# Patient Record
Sex: Male | Born: 1935 | Race: White | Hispanic: No | Marital: Married | Smoking: Never smoker
Health system: Southern US, Community
[De-identification: ages and names within clinical notes are randomized; demographics above are authoritative.]

## PROBLEM LIST (undated history)

## (undated) DIAGNOSIS — K219 Gastro-esophageal reflux disease without esophagitis: Secondary | ICD-10-CM

## (undated) DIAGNOSIS — E7800 Pure hypercholesterolemia, unspecified: Secondary | ICD-10-CM

## (undated) HISTORY — PX: BLADDER SURGERY: SHX569

## (undated) HISTORY — PX: HEMORRHOID SURGERY: SHX153

## (undated) HISTORY — PX: OTHER SURGICAL HISTORY: SHX169

---

## 2014-07-05 ENCOUNTER — Ambulatory Visit (INDEPENDENT_AMBULATORY_CARE_PROVIDER_SITE_OTHER): Payer: Medicare Other | Admitting: Neurology

## 2014-07-05 ENCOUNTER — Other Ambulatory Visit: Payer: Self-pay | Admitting: *Deleted

## 2014-07-05 DIAGNOSIS — G5601 Carpal tunnel syndrome, right upper limb: Secondary | ICD-10-CM

## 2014-07-05 DIAGNOSIS — G5602 Carpal tunnel syndrome, left upper limb: Secondary | ICD-10-CM | POA: Diagnosis not present

## 2014-07-05 DIAGNOSIS — G5603 Carpal tunnel syndrome, bilateral upper limbs: Secondary | ICD-10-CM

## 2014-07-05 NOTE — Procedures (Signed)
[facility] Neurology  [place], [place]  [place], [place] [address] Tel: [phone] Fax:  [phone] Test Date:  07/05/2014  Patient: John Palmer DOB: 01/11/2026 Physician: Narda Amber  Sex: Male Height: 5\' 8"  Ref Phys: Monico Blitz, M.D.  ID#: [id number] Temp: 35.2C Technician: Jerilynn Mages. Dean   Patient Complaints: This is a 81 year old gentleman presenting for evaluation of bilateral hand numbness, worse on the right.    NCV & EMG Findings: Extensive electrodiagnostic testing of the right upper extremity and additional studies of the left shows:  1. Right median sensory response is absent. Right ulnar and radial sensory responses are within normal limits. Left median, ulnar, radial, and palmar sensory responses are within normal limits. 2. Evaluation of the right median motor nerve showed prolonged distal onset latency (5.0 ms) and reduced amplitude (3.9 mV).  Left median and bilateral ulnar motor responses are within normal limits.  3. There is no evidence of active or chronic motor axon loss changes affecting any of the tested muscles. Motor unit configuration and recruitment pattern is within normal limits  Impression: 1. Right median neuropathy at or distal to the wrist, consistent with clinical diagnosis of carpal tunnel syndrome. Overall, these findings are severe in degree electrically. 2. There is no evidence of left carpal tunnel syndrome or a cervical radiculopathy affecting the upper extremities.    ___________________________ Narda Amber    Nerve Conduction Studies Anti Sensory Summary Table   Stim Site NR Peak (ms) Norm Peak (ms) P-T Amp (V) Norm P-T Amp  Left Median Anti Sensory (2nd Digit)  Wrist    3.3 <3.7 20.1 >20  Right Median Anti Sensory (2nd Digit)  Wrist NR  <3.7  >20  Left Radial Anti Sensory (Base 1st Digit)  Wrist    1.7 <2.7 33.3 >20  Right Radial Anti Sensory (Base 1st Digit)  Wrist    1.8 <2.7 40.5 >20  Left Ulnar Anti Sensory (5th  Digit)  Wrist    2.6 <3.5 13.9 >10  Right Ulnar Anti Sensory (5th Digit)  Wrist    2.6 <3.5 15.0 >10   Motor Summary Table   Stim Site NR Onset (ms) Norm Onset (ms) O-P Amp (mV) Norm O-P Amp Site1 Site2 Delta-0 (ms) Dist (cm) Vel (m/s) Norm Vel (m/s)  Left Median Motor (Abd Poll Brev)  Wrist    3.6 <4.4 5.6 >4 Elbow Wrist 4.4 26.0 59 >49  Elbow    8.0  4.8         Right Median Motor (Abd Poll Brev)  Wrist    5.0 <4.4 3.9 >4 Elbow Wrist 4.8 27.0 56 >49  Elbow    9.8  3.9         Left Ulnar Motor (Abd Dig Minimi)  Wrist    2.6 <3.5 8.0 >6 B Elbow Wrist 3.7 20.0 54 >49  B Elbow    6.3  7.5  A Elbow B Elbow 1.5 10.0 67 >49  A Elbow    7.8  7.5         Right Ulnar Motor (Abd Dig Minimi)  Wrist    2.5 <3.5 7.7 >6 B Elbow Wrist 3.8 21.0 55 >49  B Elbow    6.3  7.7  A Elbow B Elbow 1.5 10.0 67 >49  A Elbow    7.8  7.5          Comparison Summary Table   Stim Site NR Peak (ms) Norm Peak (ms) P-T Amp (V) Site1  Site2 Delta-P (ms) Norm Delta (ms)  Left Median/Ulnar Palm Comparison (Wrist - 8cm)  Median Palm    2.1 <2.2 46.5 Median Palm Ulnar Palm 0.0   Ulnar Palm    2.1 <2.2 14.9       EMG   Side Muscle Ins Act Fibs Psw Fasc Number Recrt Dur Dur. Amp Amp. Poly Poly. Comment  Right 1stDorInt Nml Nml Nml Nml Nml Nml Nml Nml Nml Nml Nml Nml N/A  Right Abd Poll Brev Nml Nml Nml Nml Nml Nml Nml Nml Nml Nml Nml Nml N/A  Right Ext Indicis Nml Nml Nml Nml Nml Nml Nml Nml Nml Nml Nml Nml N/A  Right PronatorTeres Nml Nml Nml Nml Nml Nml Nml Nml Nml Nml Nml Nml N/A  Right Biceps Nml Nml Nml Nml Nml Nml Nml Nml Nml Nml Nml Nml N/A  Right Triceps Nml Nml Nml Nml Nml Nml Nml Nml Nml Nml Nml Nml N/A  Right Deltoid Nml Nml Nml Nml Nml Nml Nml Nml Nml Nml Nml Nml N/A  Left 1stDorInt Nml Nml Nml Nml Nml Nml Nml Nml Nml Nml Nml Nml N/A  Left Ext Indicis Nml Nml Nml Nml Nml Nml Nml Nml Nml Nml Nml Nml N/A  Left PronatorTeres Nml Nml Nml Nml Nml Nml Nml Nml Nml Nml Nml Nml N/A  Left Biceps Nml Nml Nml Nml  Nml Nml Nml Nml Nml Nml Nml Nml N/A  Left Triceps Nml Nml Nml Nml Nml Nml Nml Nml Nml Nml Nml Nml N/A  Left Deltoid Nml Nml Nml Nml Nml Nml Nml Nml Nml Nml Nml Nml N/A      Waveforms:

## 2014-07-11 ENCOUNTER — Telehealth: Payer: Self-pay | Admitting: *Deleted

## 2014-07-11 NOTE — Telephone Encounter (Signed)
Dr. Manuella Ghazi will not be back until the end of the month and they are wondering what they should do.  Any suggestions?

## 2014-07-11 NOTE — Telephone Encounter (Signed)
Mrs. Finucane notified.

## 2014-07-11 NOTE — Telephone Encounter (Signed)
patients wife would like to speak with you in reference to this patient John Palmer call back number [phone]

## 2014-07-11 NOTE — Telephone Encounter (Signed)
EMG showed right carpal tunnel syndrome only.  Please inform patient that they should call his office for additional questions as one of the other providers should be covering his patients.   Amand Lemoine K. Posey Pronto, DO

## 2015-03-06 DIAGNOSIS — G4733 Obstructive sleep apnea (adult) (pediatric): Secondary | ICD-10-CM | POA: Diagnosis not present

## 2015-03-06 DIAGNOSIS — G2581 Restless legs syndrome: Secondary | ICD-10-CM | POA: Diagnosis not present

## 2015-03-06 DIAGNOSIS — Z8673 Personal history of transient ischemic attack (TIA), and cerebral infarction without residual deficits: Secondary | ICD-10-CM | POA: Diagnosis not present

## 2015-03-06 DIAGNOSIS — R0902 Hypoxemia: Secondary | ICD-10-CM | POA: Diagnosis not present

## 2015-03-06 DIAGNOSIS — Z9989 Dependence on other enabling machines and devices: Secondary | ICD-10-CM | POA: Diagnosis not present

## 2015-03-07 DIAGNOSIS — N183 Chronic kidney disease, stage 3 (moderate): Secondary | ICD-10-CM | POA: Diagnosis not present

## 2015-03-07 DIAGNOSIS — E1122 Type 2 diabetes mellitus with diabetic chronic kidney disease: Secondary | ICD-10-CM | POA: Diagnosis not present

## 2015-03-07 DIAGNOSIS — Z6835 Body mass index (BMI) 35.0-35.9, adult: Secondary | ICD-10-CM | POA: Diagnosis not present

## 2015-03-07 DIAGNOSIS — Z87891 Personal history of nicotine dependence: Secondary | ICD-10-CM | POA: Diagnosis not present

## 2015-04-20 DIAGNOSIS — E7800 Pure hypercholesterolemia, unspecified: Secondary | ICD-10-CM | POA: Diagnosis not present

## 2015-04-20 DIAGNOSIS — M159 Polyosteoarthritis, unspecified: Secondary | ICD-10-CM | POA: Diagnosis not present

## 2015-04-20 DIAGNOSIS — J449 Chronic obstructive pulmonary disease, unspecified: Secondary | ICD-10-CM | POA: Diagnosis not present

## 2015-04-20 DIAGNOSIS — E119 Type 2 diabetes mellitus without complications: Secondary | ICD-10-CM | POA: Diagnosis not present

## 2015-05-10 DIAGNOSIS — D4989 Neoplasm of unspecified behavior of other specified sites: Secondary | ICD-10-CM | POA: Diagnosis not present

## 2015-06-14 DIAGNOSIS — E1122 Type 2 diabetes mellitus with diabetic chronic kidney disease: Secondary | ICD-10-CM | POA: Diagnosis not present

## 2015-06-14 DIAGNOSIS — G459 Transient cerebral ischemic attack, unspecified: Secondary | ICD-10-CM | POA: Diagnosis not present

## 2015-06-14 DIAGNOSIS — E7800 Pure hypercholesterolemia, unspecified: Secondary | ICD-10-CM | POA: Diagnosis not present

## 2015-06-14 DIAGNOSIS — N183 Chronic kidney disease, stage 3 (moderate): Secondary | ICD-10-CM | POA: Diagnosis not present

## 2015-07-04 DIAGNOSIS — J449 Chronic obstructive pulmonary disease, unspecified: Secondary | ICD-10-CM | POA: Diagnosis not present

## 2015-07-04 DIAGNOSIS — M159 Polyosteoarthritis, unspecified: Secondary | ICD-10-CM | POA: Diagnosis not present

## 2015-07-04 DIAGNOSIS — E7800 Pure hypercholesterolemia, unspecified: Secondary | ICD-10-CM | POA: Diagnosis not present

## 2015-07-04 DIAGNOSIS — E119 Type 2 diabetes mellitus without complications: Secondary | ICD-10-CM | POA: Diagnosis not present

## 2015-07-05 DIAGNOSIS — R200 Anesthesia of skin: Secondary | ICD-10-CM | POA: Diagnosis not present

## 2015-07-05 DIAGNOSIS — R202 Paresthesia of skin: Secondary | ICD-10-CM | POA: Diagnosis not present

## 2015-07-17 DIAGNOSIS — G5602 Carpal tunnel syndrome, left upper limb: Secondary | ICD-10-CM | POA: Diagnosis not present

## 2015-09-08 DIAGNOSIS — R200 Anesthesia of skin: Secondary | ICD-10-CM | POA: Diagnosis not present

## 2015-09-08 DIAGNOSIS — R202 Paresthesia of skin: Secondary | ICD-10-CM | POA: Diagnosis not present

## 2015-09-08 DIAGNOSIS — M790 Rheumatism, unspecified: Secondary | ICD-10-CM | POA: Diagnosis not present

## 2015-09-18 DIAGNOSIS — R0902 Hypoxemia: Secondary | ICD-10-CM | POA: Diagnosis not present

## 2015-09-18 DIAGNOSIS — G4733 Obstructive sleep apnea (adult) (pediatric): Secondary | ICD-10-CM | POA: Diagnosis not present

## 2015-09-27 DIAGNOSIS — E7800 Pure hypercholesterolemia, unspecified: Secondary | ICD-10-CM | POA: Diagnosis not present

## 2015-09-27 DIAGNOSIS — G459 Transient cerebral ischemic attack, unspecified: Secondary | ICD-10-CM | POA: Diagnosis not present

## 2015-09-27 DIAGNOSIS — G4730 Sleep apnea, unspecified: Secondary | ICD-10-CM | POA: Diagnosis not present

## 2015-09-27 DIAGNOSIS — G2581 Restless legs syndrome: Secondary | ICD-10-CM | POA: Diagnosis not present

## 2015-09-27 DIAGNOSIS — Z881 Allergy status to other antibiotic agents status: Secondary | ICD-10-CM | POA: Diagnosis not present

## 2015-09-27 DIAGNOSIS — F418 Other specified anxiety disorders: Secondary | ICD-10-CM | POA: Diagnosis not present

## 2015-09-27 DIAGNOSIS — J449 Chronic obstructive pulmonary disease, unspecified: Secondary | ICD-10-CM | POA: Diagnosis not present

## 2015-09-27 DIAGNOSIS — K219 Gastro-esophageal reflux disease without esophagitis: Secondary | ICD-10-CM | POA: Diagnosis not present

## 2015-09-27 DIAGNOSIS — Z87891 Personal history of nicotine dependence: Secondary | ICD-10-CM | POA: Diagnosis not present

## 2015-09-27 DIAGNOSIS — Z9981 Dependence on supplemental oxygen: Secondary | ICD-10-CM | POA: Diagnosis not present

## 2015-09-27 DIAGNOSIS — Z7982 Long term (current) use of aspirin: Secondary | ICD-10-CM | POA: Diagnosis not present

## 2015-09-27 DIAGNOSIS — Z8673 Personal history of transient ischemic attack (TIA), and cerebral infarction without residual deficits: Secondary | ICD-10-CM | POA: Diagnosis not present

## 2015-09-27 DIAGNOSIS — G5602 Carpal tunnel syndrome, left upper limb: Secondary | ICD-10-CM | POA: Diagnosis not present

## 2015-09-28 DIAGNOSIS — E119 Type 2 diabetes mellitus without complications: Secondary | ICD-10-CM | POA: Diagnosis not present

## 2015-09-28 DIAGNOSIS — E7800 Pure hypercholesterolemia, unspecified: Secondary | ICD-10-CM | POA: Diagnosis not present

## 2015-09-28 DIAGNOSIS — M159 Polyosteoarthritis, unspecified: Secondary | ICD-10-CM | POA: Diagnosis not present

## 2015-09-28 DIAGNOSIS — J449 Chronic obstructive pulmonary disease, unspecified: Secondary | ICD-10-CM | POA: Diagnosis not present

## 2015-11-09 DIAGNOSIS — Z23 Encounter for immunization: Secondary | ICD-10-CM | POA: Diagnosis not present

## 2015-11-17 DIAGNOSIS — M25511 Pain in right shoulder: Secondary | ICD-10-CM | POA: Diagnosis not present

## 2015-11-17 DIAGNOSIS — M25512 Pain in left shoulder: Secondary | ICD-10-CM | POA: Diagnosis not present

## 2015-11-23 DIAGNOSIS — M25512 Pain in left shoulder: Secondary | ICD-10-CM | POA: Diagnosis not present

## 2015-11-27 DIAGNOSIS — Z1389 Encounter for screening for other disorder: Secondary | ICD-10-CM | POA: Diagnosis not present

## 2015-11-27 DIAGNOSIS — R5383 Other fatigue: Secondary | ICD-10-CM | POA: Diagnosis not present

## 2015-11-27 DIAGNOSIS — Z1211 Encounter for screening for malignant neoplasm of colon: Secondary | ICD-10-CM | POA: Diagnosis not present

## 2015-11-27 DIAGNOSIS — E1122 Type 2 diabetes mellitus with diabetic chronic kidney disease: Secondary | ICD-10-CM | POA: Diagnosis not present

## 2015-11-27 DIAGNOSIS — E7800 Pure hypercholesterolemia, unspecified: Secondary | ICD-10-CM | POA: Diagnosis not present

## 2015-11-27 DIAGNOSIS — Z7189 Other specified counseling: Secondary | ICD-10-CM | POA: Diagnosis not present

## 2015-11-27 DIAGNOSIS — Z299 Encounter for prophylactic measures, unspecified: Secondary | ICD-10-CM | POA: Diagnosis not present

## 2015-11-27 DIAGNOSIS — Z6834 Body mass index (BMI) 34.0-34.9, adult: Secondary | ICD-10-CM | POA: Diagnosis not present

## 2015-11-27 DIAGNOSIS — Z0000 Encounter for general adult medical examination without abnormal findings: Secondary | ICD-10-CM | POA: Diagnosis not present

## 2015-11-28 DIAGNOSIS — J449 Chronic obstructive pulmonary disease, unspecified: Secondary | ICD-10-CM | POA: Diagnosis not present

## 2015-11-28 DIAGNOSIS — E119 Type 2 diabetes mellitus without complications: Secondary | ICD-10-CM | POA: Diagnosis not present

## 2015-11-28 DIAGNOSIS — Z125 Encounter for screening for malignant neoplasm of prostate: Secondary | ICD-10-CM | POA: Diagnosis not present

## 2015-11-28 DIAGNOSIS — E7800 Pure hypercholesterolemia, unspecified: Secondary | ICD-10-CM | POA: Diagnosis not present

## 2015-11-28 DIAGNOSIS — Z79899 Other long term (current) drug therapy: Secondary | ICD-10-CM | POA: Diagnosis not present

## 2015-11-28 DIAGNOSIS — R5383 Other fatigue: Secondary | ICD-10-CM | POA: Diagnosis not present

## 2015-11-28 DIAGNOSIS — M159 Polyosteoarthritis, unspecified: Secondary | ICD-10-CM | POA: Diagnosis not present

## 2015-12-15 DIAGNOSIS — R5383 Other fatigue: Secondary | ICD-10-CM | POA: Diagnosis not present

## 2015-12-28 DIAGNOSIS — E7800 Pure hypercholesterolemia, unspecified: Secondary | ICD-10-CM | POA: Diagnosis not present

## 2015-12-28 DIAGNOSIS — M159 Polyosteoarthritis, unspecified: Secondary | ICD-10-CM | POA: Diagnosis not present

## 2015-12-28 DIAGNOSIS — J449 Chronic obstructive pulmonary disease, unspecified: Secondary | ICD-10-CM | POA: Diagnosis not present

## 2015-12-28 DIAGNOSIS — E119 Type 2 diabetes mellitus without complications: Secondary | ICD-10-CM | POA: Diagnosis not present

## 2016-01-19 DIAGNOSIS — M159 Polyosteoarthritis, unspecified: Secondary | ICD-10-CM | POA: Diagnosis not present

## 2016-01-19 DIAGNOSIS — J449 Chronic obstructive pulmonary disease, unspecified: Secondary | ICD-10-CM | POA: Diagnosis not present

## 2016-01-19 DIAGNOSIS — E119 Type 2 diabetes mellitus without complications: Secondary | ICD-10-CM | POA: Diagnosis not present

## 2016-01-19 DIAGNOSIS — E7800 Pure hypercholesterolemia, unspecified: Secondary | ICD-10-CM | POA: Diagnosis not present

## 2016-03-06 DIAGNOSIS — G4733 Obstructive sleep apnea (adult) (pediatric): Secondary | ICD-10-CM | POA: Diagnosis not present

## 2016-03-06 DIAGNOSIS — G4734 Idiopathic sleep related nonobstructive alveolar hypoventilation: Secondary | ICD-10-CM | POA: Diagnosis not present

## 2016-03-25 DIAGNOSIS — E7800 Pure hypercholesterolemia, unspecified: Secondary | ICD-10-CM | POA: Diagnosis not present

## 2016-03-25 DIAGNOSIS — M159 Polyosteoarthritis, unspecified: Secondary | ICD-10-CM | POA: Diagnosis not present

## 2016-03-25 DIAGNOSIS — E119 Type 2 diabetes mellitus without complications: Secondary | ICD-10-CM | POA: Diagnosis not present

## 2016-03-25 DIAGNOSIS — J449 Chronic obstructive pulmonary disease, unspecified: Secondary | ICD-10-CM | POA: Diagnosis not present

## 2016-05-01 DIAGNOSIS — J449 Chronic obstructive pulmonary disease, unspecified: Secondary | ICD-10-CM | POA: Diagnosis not present

## 2016-05-01 DIAGNOSIS — K219 Gastro-esophageal reflux disease without esophagitis: Secondary | ICD-10-CM | POA: Diagnosis not present

## 2016-05-01 DIAGNOSIS — N400 Enlarged prostate without lower urinary tract symptoms: Secondary | ICD-10-CM | POA: Diagnosis not present

## 2016-05-01 DIAGNOSIS — G5603 Carpal tunnel syndrome, bilateral upper limbs: Secondary | ICD-10-CM | POA: Diagnosis not present

## 2016-05-01 DIAGNOSIS — E7800 Pure hypercholesterolemia, unspecified: Secondary | ICD-10-CM | POA: Diagnosis not present

## 2016-05-01 DIAGNOSIS — F419 Anxiety disorder, unspecified: Secondary | ICD-10-CM | POA: Diagnosis not present

## 2016-05-01 DIAGNOSIS — E1122 Type 2 diabetes mellitus with diabetic chronic kidney disease: Secondary | ICD-10-CM | POA: Diagnosis not present

## 2016-05-01 DIAGNOSIS — Z6836 Body mass index (BMI) 36.0-36.9, adult: Secondary | ICD-10-CM | POA: Diagnosis not present

## 2016-05-01 DIAGNOSIS — G4733 Obstructive sleep apnea (adult) (pediatric): Secondary | ICD-10-CM | POA: Diagnosis not present

## 2016-05-01 DIAGNOSIS — Z299 Encounter for prophylactic measures, unspecified: Secondary | ICD-10-CM | POA: Diagnosis not present

## 2016-05-01 DIAGNOSIS — N181 Chronic kidney disease, stage 1: Secondary | ICD-10-CM | POA: Diagnosis not present

## 2016-05-01 DIAGNOSIS — Z87891 Personal history of nicotine dependence: Secondary | ICD-10-CM | POA: Diagnosis not present

## 2016-05-07 DIAGNOSIS — G4733 Obstructive sleep apnea (adult) (pediatric): Secondary | ICD-10-CM | POA: Diagnosis not present

## 2016-05-08 DIAGNOSIS — D3132 Benign neoplasm of left choroid: Secondary | ICD-10-CM | POA: Diagnosis not present

## 2016-05-29 DIAGNOSIS — Z85828 Personal history of other malignant neoplasm of skin: Secondary | ICD-10-CM | POA: Diagnosis not present

## 2016-05-29 DIAGNOSIS — L570 Actinic keratosis: Secondary | ICD-10-CM | POA: Diagnosis not present

## 2016-05-29 DIAGNOSIS — L814 Other melanin hyperpigmentation: Secondary | ICD-10-CM | POA: Diagnosis not present

## 2016-05-29 DIAGNOSIS — D1801 Hemangioma of skin and subcutaneous tissue: Secondary | ICD-10-CM | POA: Diagnosis not present

## 2016-05-29 DIAGNOSIS — Z08 Encounter for follow-up examination after completed treatment for malignant neoplasm: Secondary | ICD-10-CM | POA: Diagnosis not present

## 2016-05-29 DIAGNOSIS — L821 Other seborrheic keratosis: Secondary | ICD-10-CM | POA: Diagnosis not present

## 2016-06-27 DIAGNOSIS — R9431 Abnormal electrocardiogram [ECG] [EKG]: Secondary | ICD-10-CM | POA: Diagnosis not present

## 2016-06-27 DIAGNOSIS — E785 Hyperlipidemia, unspecified: Secondary | ICD-10-CM | POA: Diagnosis not present

## 2016-06-27 DIAGNOSIS — I208 Other forms of angina pectoris: Secondary | ICD-10-CM | POA: Diagnosis not present

## 2016-06-27 DIAGNOSIS — R011 Cardiac murmur, unspecified: Secondary | ICD-10-CM | POA: Diagnosis not present

## 2016-06-27 DIAGNOSIS — G4733 Obstructive sleep apnea (adult) (pediatric): Secondary | ICD-10-CM | POA: Diagnosis not present

## 2016-06-27 DIAGNOSIS — G459 Transient cerebral ischemic attack, unspecified: Secondary | ICD-10-CM | POA: Diagnosis not present

## 2016-06-27 DIAGNOSIS — I4510 Unspecified right bundle-branch block: Secondary | ICD-10-CM | POA: Diagnosis not present

## 2016-06-27 DIAGNOSIS — R0609 Other forms of dyspnea: Secondary | ICD-10-CM | POA: Diagnosis not present

## 2016-07-01 DIAGNOSIS — I083 Combined rheumatic disorders of mitral, aortic and tricuspid valves: Secondary | ICD-10-CM | POA: Diagnosis not present

## 2016-07-01 DIAGNOSIS — R9431 Abnormal electrocardiogram [ECG] [EKG]: Secondary | ICD-10-CM | POA: Diagnosis not present

## 2016-07-01 DIAGNOSIS — R011 Cardiac murmur, unspecified: Secondary | ICD-10-CM | POA: Diagnosis not present

## 2016-07-01 DIAGNOSIS — R0609 Other forms of dyspnea: Secondary | ICD-10-CM | POA: Diagnosis not present

## 2016-07-12 DIAGNOSIS — I208 Other forms of angina pectoris: Secondary | ICD-10-CM | POA: Diagnosis not present

## 2016-07-12 DIAGNOSIS — R9431 Abnormal electrocardiogram [ECG] [EKG]: Secondary | ICD-10-CM | POA: Diagnosis not present

## 2016-07-12 DIAGNOSIS — R011 Cardiac murmur, unspecified: Secondary | ICD-10-CM | POA: Diagnosis not present

## 2016-07-12 DIAGNOSIS — R0609 Other forms of dyspnea: Secondary | ICD-10-CM | POA: Diagnosis not present

## 2016-07-12 DIAGNOSIS — G4733 Obstructive sleep apnea (adult) (pediatric): Secondary | ICD-10-CM | POA: Diagnosis not present

## 2016-07-15 DIAGNOSIS — Z6833 Body mass index (BMI) 33.0-33.9, adult: Secondary | ICD-10-CM | POA: Diagnosis not present

## 2016-07-15 DIAGNOSIS — G4734 Idiopathic sleep related nonobstructive alveolar hypoventilation: Secondary | ICD-10-CM | POA: Diagnosis not present

## 2016-07-15 DIAGNOSIS — E669 Obesity, unspecified: Secondary | ICD-10-CM | POA: Diagnosis not present

## 2016-07-15 DIAGNOSIS — G4733 Obstructive sleep apnea (adult) (pediatric): Secondary | ICD-10-CM | POA: Diagnosis not present

## 2016-07-15 DIAGNOSIS — R011 Cardiac murmur, unspecified: Secondary | ICD-10-CM | POA: Diagnosis not present

## 2016-07-16 DIAGNOSIS — R9431 Abnormal electrocardiogram [ECG] [EKG]: Secondary | ICD-10-CM | POA: Diagnosis not present

## 2016-07-16 DIAGNOSIS — R011 Cardiac murmur, unspecified: Secondary | ICD-10-CM | POA: Diagnosis not present

## 2016-07-16 DIAGNOSIS — R0602 Shortness of breath: Secondary | ICD-10-CM | POA: Diagnosis not present

## 2016-07-16 DIAGNOSIS — I209 Angina pectoris, unspecified: Secondary | ICD-10-CM | POA: Diagnosis not present

## 2016-07-16 DIAGNOSIS — R0789 Other chest pain: Secondary | ICD-10-CM | POA: Diagnosis not present

## 2016-07-16 DIAGNOSIS — R0609 Other forms of dyspnea: Secondary | ICD-10-CM | POA: Diagnosis not present

## 2016-08-07 DIAGNOSIS — E7800 Pure hypercholesterolemia, unspecified: Secondary | ICD-10-CM | POA: Diagnosis not present

## 2016-08-07 DIAGNOSIS — J449 Chronic obstructive pulmonary disease, unspecified: Secondary | ICD-10-CM | POA: Diagnosis not present

## 2016-08-07 DIAGNOSIS — Z299 Encounter for prophylactic measures, unspecified: Secondary | ICD-10-CM | POA: Diagnosis not present

## 2016-08-07 DIAGNOSIS — Z6835 Body mass index (BMI) 35.0-35.9, adult: Secondary | ICD-10-CM | POA: Diagnosis not present

## 2016-08-07 DIAGNOSIS — F419 Anxiety disorder, unspecified: Secondary | ICD-10-CM | POA: Diagnosis not present

## 2016-08-07 DIAGNOSIS — E1122 Type 2 diabetes mellitus with diabetic chronic kidney disease: Secondary | ICD-10-CM | POA: Diagnosis not present

## 2016-08-07 DIAGNOSIS — R197 Diarrhea, unspecified: Secondary | ICD-10-CM | POA: Diagnosis not present

## 2016-08-07 DIAGNOSIS — N400 Enlarged prostate without lower urinary tract symptoms: Secondary | ICD-10-CM | POA: Diagnosis not present

## 2016-08-07 DIAGNOSIS — G4733 Obstructive sleep apnea (adult) (pediatric): Secondary | ICD-10-CM | POA: Diagnosis not present

## 2016-08-07 DIAGNOSIS — N183 Chronic kidney disease, stage 3 (moderate): Secondary | ICD-10-CM | POA: Diagnosis not present

## 2016-08-29 DIAGNOSIS — R0902 Hypoxemia: Secondary | ICD-10-CM | POA: Diagnosis not present

## 2016-08-29 DIAGNOSIS — G4733 Obstructive sleep apnea (adult) (pediatric): Secondary | ICD-10-CM | POA: Diagnosis not present

## 2016-09-09 DIAGNOSIS — G4734 Idiopathic sleep related nonobstructive alveolar hypoventilation: Secondary | ICD-10-CM | POA: Diagnosis not present

## 2016-09-09 DIAGNOSIS — G4733 Obstructive sleep apnea (adult) (pediatric): Secondary | ICD-10-CM | POA: Diagnosis not present

## 2016-09-09 DIAGNOSIS — Z6833 Body mass index (BMI) 33.0-33.9, adult: Secondary | ICD-10-CM | POA: Diagnosis not present

## 2016-09-09 DIAGNOSIS — R030 Elevated blood-pressure reading, without diagnosis of hypertension: Secondary | ICD-10-CM | POA: Diagnosis not present

## 2016-09-09 DIAGNOSIS — G2581 Restless legs syndrome: Secondary | ICD-10-CM | POA: Diagnosis not present

## 2016-09-10 DIAGNOSIS — G4733 Obstructive sleep apnea (adult) (pediatric): Secondary | ICD-10-CM | POA: Diagnosis not present

## 2016-10-21 DIAGNOSIS — G4734 Idiopathic sleep related nonobstructive alveolar hypoventilation: Secondary | ICD-10-CM | POA: Diagnosis not present

## 2016-10-21 DIAGNOSIS — Z23 Encounter for immunization: Secondary | ICD-10-CM | POA: Diagnosis not present

## 2016-10-21 DIAGNOSIS — E669 Obesity, unspecified: Secondary | ICD-10-CM | POA: Diagnosis not present

## 2016-10-21 DIAGNOSIS — G4733 Obstructive sleep apnea (adult) (pediatric): Secondary | ICD-10-CM | POA: Diagnosis not present

## 2016-10-23 DIAGNOSIS — M159 Polyosteoarthritis, unspecified: Secondary | ICD-10-CM | POA: Diagnosis not present

## 2016-10-23 DIAGNOSIS — J449 Chronic obstructive pulmonary disease, unspecified: Secondary | ICD-10-CM | POA: Diagnosis not present

## 2016-10-23 DIAGNOSIS — E119 Type 2 diabetes mellitus without complications: Secondary | ICD-10-CM | POA: Diagnosis not present

## 2016-10-23 DIAGNOSIS — E7800 Pure hypercholesterolemia, unspecified: Secondary | ICD-10-CM | POA: Diagnosis not present

## 2016-11-13 DIAGNOSIS — E1122 Type 2 diabetes mellitus with diabetic chronic kidney disease: Secondary | ICD-10-CM | POA: Diagnosis not present

## 2016-11-13 DIAGNOSIS — J449 Chronic obstructive pulmonary disease, unspecified: Secondary | ICD-10-CM | POA: Diagnosis not present

## 2016-11-13 DIAGNOSIS — E1165 Type 2 diabetes mellitus with hyperglycemia: Secondary | ICD-10-CM | POA: Diagnosis not present

## 2016-11-13 DIAGNOSIS — Z6835 Body mass index (BMI) 35.0-35.9, adult: Secondary | ICD-10-CM | POA: Diagnosis not present

## 2016-11-13 DIAGNOSIS — N183 Chronic kidney disease, stage 3 (moderate): Secondary | ICD-10-CM | POA: Diagnosis not present

## 2016-11-13 DIAGNOSIS — Z299 Encounter for prophylactic measures, unspecified: Secondary | ICD-10-CM | POA: Diagnosis not present

## 2016-11-22 DIAGNOSIS — M159 Polyosteoarthritis, unspecified: Secondary | ICD-10-CM | POA: Diagnosis not present

## 2016-11-22 DIAGNOSIS — E119 Type 2 diabetes mellitus without complications: Secondary | ICD-10-CM | POA: Diagnosis not present

## 2016-11-22 DIAGNOSIS — E7800 Pure hypercholesterolemia, unspecified: Secondary | ICD-10-CM | POA: Diagnosis not present

## 2016-11-22 DIAGNOSIS — J449 Chronic obstructive pulmonary disease, unspecified: Secondary | ICD-10-CM | POA: Diagnosis not present

## 2016-12-12 DIAGNOSIS — Z1339 Encounter for screening examination for other mental health and behavioral disorders: Secondary | ICD-10-CM | POA: Diagnosis not present

## 2016-12-12 DIAGNOSIS — E7800 Pure hypercholesterolemia, unspecified: Secondary | ICD-10-CM | POA: Diagnosis not present

## 2016-12-12 DIAGNOSIS — Z7189 Other specified counseling: Secondary | ICD-10-CM | POA: Diagnosis not present

## 2016-12-12 DIAGNOSIS — Z1331 Encounter for screening for depression: Secondary | ICD-10-CM | POA: Diagnosis not present

## 2016-12-12 DIAGNOSIS — R5383 Other fatigue: Secondary | ICD-10-CM | POA: Diagnosis not present

## 2016-12-12 DIAGNOSIS — Z0000 Encounter for general adult medical examination without abnormal findings: Secondary | ICD-10-CM | POA: Diagnosis not present

## 2016-12-12 DIAGNOSIS — Z299 Encounter for prophylactic measures, unspecified: Secondary | ICD-10-CM | POA: Diagnosis not present

## 2016-12-12 DIAGNOSIS — Z79899 Other long term (current) drug therapy: Secondary | ICD-10-CM | POA: Diagnosis not present

## 2016-12-12 DIAGNOSIS — F419 Anxiety disorder, unspecified: Secondary | ICD-10-CM | POA: Diagnosis not present

## 2016-12-12 DIAGNOSIS — Z6834 Body mass index (BMI) 34.0-34.9, adult: Secondary | ICD-10-CM | POA: Diagnosis not present

## 2016-12-12 DIAGNOSIS — Z125 Encounter for screening for malignant neoplasm of prostate: Secondary | ICD-10-CM | POA: Diagnosis not present

## 2017-01-17 DIAGNOSIS — Z9181 History of falling: Secondary | ICD-10-CM | POA: Diagnosis not present

## 2017-01-17 DIAGNOSIS — G4733 Obstructive sleep apnea (adult) (pediatric): Secondary | ICD-10-CM | POA: Diagnosis not present

## 2017-01-17 DIAGNOSIS — R011 Cardiac murmur, unspecified: Secondary | ICD-10-CM | POA: Diagnosis not present

## 2017-01-17 DIAGNOSIS — G2581 Restless legs syndrome: Secondary | ICD-10-CM | POA: Diagnosis not present

## 2017-01-17 DIAGNOSIS — Z6833 Body mass index (BMI) 33.0-33.9, adult: Secondary | ICD-10-CM | POA: Diagnosis not present

## 2017-02-12 DIAGNOSIS — Z6835 Body mass index (BMI) 35.0-35.9, adult: Secondary | ICD-10-CM | POA: Diagnosis not present

## 2017-02-12 DIAGNOSIS — Z299 Encounter for prophylactic measures, unspecified: Secondary | ICD-10-CM | POA: Diagnosis not present

## 2017-02-12 DIAGNOSIS — E1122 Type 2 diabetes mellitus with diabetic chronic kidney disease: Secondary | ICD-10-CM | POA: Diagnosis not present

## 2017-02-12 DIAGNOSIS — E1165 Type 2 diabetes mellitus with hyperglycemia: Secondary | ICD-10-CM | POA: Diagnosis not present

## 2017-02-12 DIAGNOSIS — N183 Chronic kidney disease, stage 3 (moderate): Secondary | ICD-10-CM | POA: Diagnosis not present

## 2017-02-13 DIAGNOSIS — E7800 Pure hypercholesterolemia, unspecified: Secondary | ICD-10-CM | POA: Diagnosis not present

## 2017-02-13 DIAGNOSIS — J449 Chronic obstructive pulmonary disease, unspecified: Secondary | ICD-10-CM | POA: Diagnosis not present

## 2017-02-13 DIAGNOSIS — E119 Type 2 diabetes mellitus without complications: Secondary | ICD-10-CM | POA: Diagnosis not present

## 2017-02-13 DIAGNOSIS — M159 Polyosteoarthritis, unspecified: Secondary | ICD-10-CM | POA: Diagnosis not present

## 2017-03-06 DIAGNOSIS — Z9181 History of falling: Secondary | ICD-10-CM | POA: Diagnosis not present

## 2017-03-06 DIAGNOSIS — G4733 Obstructive sleep apnea (adult) (pediatric): Secondary | ICD-10-CM | POA: Diagnosis not present

## 2017-03-14 DIAGNOSIS — M159 Polyosteoarthritis, unspecified: Secondary | ICD-10-CM | POA: Diagnosis not present

## 2017-03-14 DIAGNOSIS — J449 Chronic obstructive pulmonary disease, unspecified: Secondary | ICD-10-CM | POA: Diagnosis not present

## 2017-03-14 DIAGNOSIS — E7800 Pure hypercholesterolemia, unspecified: Secondary | ICD-10-CM | POA: Diagnosis not present

## 2017-03-14 DIAGNOSIS — E119 Type 2 diabetes mellitus without complications: Secondary | ICD-10-CM | POA: Diagnosis not present

## 2017-03-21 DIAGNOSIS — R011 Cardiac murmur, unspecified: Secondary | ICD-10-CM | POA: Diagnosis not present

## 2017-03-21 DIAGNOSIS — G459 Transient cerebral ischemic attack, unspecified: Secondary | ICD-10-CM | POA: Diagnosis not present

## 2017-03-21 DIAGNOSIS — I208 Other forms of angina pectoris: Secondary | ICD-10-CM | POA: Diagnosis not present

## 2017-03-21 DIAGNOSIS — R0609 Other forms of dyspnea: Secondary | ICD-10-CM | POA: Diagnosis not present

## 2017-03-21 DIAGNOSIS — E785 Hyperlipidemia, unspecified: Secondary | ICD-10-CM | POA: Diagnosis not present

## 2017-03-21 DIAGNOSIS — G4733 Obstructive sleep apnea (adult) (pediatric): Secondary | ICD-10-CM | POA: Diagnosis not present

## 2017-03-21 DIAGNOSIS — R9431 Abnormal electrocardiogram [ECG] [EKG]: Secondary | ICD-10-CM | POA: Diagnosis not present

## 2017-04-10 DIAGNOSIS — M159 Polyosteoarthritis, unspecified: Secondary | ICD-10-CM | POA: Diagnosis not present

## 2017-04-10 DIAGNOSIS — E7800 Pure hypercholesterolemia, unspecified: Secondary | ICD-10-CM | POA: Diagnosis not present

## 2017-04-10 DIAGNOSIS — J449 Chronic obstructive pulmonary disease, unspecified: Secondary | ICD-10-CM | POA: Diagnosis not present

## 2017-04-10 DIAGNOSIS — E119 Type 2 diabetes mellitus without complications: Secondary | ICD-10-CM | POA: Diagnosis not present

## 2017-05-19 DIAGNOSIS — Z87891 Personal history of nicotine dependence: Secondary | ICD-10-CM | POA: Diagnosis not present

## 2017-05-19 DIAGNOSIS — Z299 Encounter for prophylactic measures, unspecified: Secondary | ICD-10-CM | POA: Diagnosis not present

## 2017-05-19 DIAGNOSIS — E1165 Type 2 diabetes mellitus with hyperglycemia: Secondary | ICD-10-CM | POA: Diagnosis not present

## 2017-05-19 DIAGNOSIS — Z6836 Body mass index (BMI) 36.0-36.9, adult: Secondary | ICD-10-CM | POA: Diagnosis not present

## 2017-05-19 DIAGNOSIS — E1122 Type 2 diabetes mellitus with diabetic chronic kidney disease: Secondary | ICD-10-CM | POA: Diagnosis not present

## 2017-05-21 DIAGNOSIS — M159 Polyosteoarthritis, unspecified: Secondary | ICD-10-CM | POA: Diagnosis not present

## 2017-05-21 DIAGNOSIS — J449 Chronic obstructive pulmonary disease, unspecified: Secondary | ICD-10-CM | POA: Diagnosis not present

## 2017-05-21 DIAGNOSIS — E119 Type 2 diabetes mellitus without complications: Secondary | ICD-10-CM | POA: Diagnosis not present

## 2017-05-21 DIAGNOSIS — E7800 Pure hypercholesterolemia, unspecified: Secondary | ICD-10-CM | POA: Diagnosis not present

## 2017-06-13 DIAGNOSIS — M159 Polyosteoarthritis, unspecified: Secondary | ICD-10-CM | POA: Diagnosis not present

## 2017-06-13 DIAGNOSIS — E119 Type 2 diabetes mellitus without complications: Secondary | ICD-10-CM | POA: Diagnosis not present

## 2017-06-13 DIAGNOSIS — J449 Chronic obstructive pulmonary disease, unspecified: Secondary | ICD-10-CM | POA: Diagnosis not present

## 2017-06-13 DIAGNOSIS — E7800 Pure hypercholesterolemia, unspecified: Secondary | ICD-10-CM | POA: Diagnosis not present

## 2017-07-14 DIAGNOSIS — Z961 Presence of intraocular lens: Secondary | ICD-10-CM | POA: Diagnosis not present

## 2017-07-14 DIAGNOSIS — H40023 Open angle with borderline findings, high risk, bilateral: Secondary | ICD-10-CM | POA: Diagnosis not present

## 2017-07-14 DIAGNOSIS — H0289 Other specified disorders of eyelid: Secondary | ICD-10-CM | POA: Diagnosis not present

## 2017-07-15 DIAGNOSIS — H40023 Open angle with borderline findings, high risk, bilateral: Secondary | ICD-10-CM | POA: Diagnosis not present

## 2017-08-05 DIAGNOSIS — M159 Polyosteoarthritis, unspecified: Secondary | ICD-10-CM | POA: Diagnosis not present

## 2017-08-05 DIAGNOSIS — E7800 Pure hypercholesterolemia, unspecified: Secondary | ICD-10-CM | POA: Diagnosis not present

## 2017-08-05 DIAGNOSIS — J449 Chronic obstructive pulmonary disease, unspecified: Secondary | ICD-10-CM | POA: Diagnosis not present

## 2017-08-05 DIAGNOSIS — E119 Type 2 diabetes mellitus without complications: Secondary | ICD-10-CM | POA: Diagnosis not present

## 2017-08-25 DIAGNOSIS — Z299 Encounter for prophylactic measures, unspecified: Secondary | ICD-10-CM | POA: Diagnosis not present

## 2017-08-25 DIAGNOSIS — E1122 Type 2 diabetes mellitus with diabetic chronic kidney disease: Secondary | ICD-10-CM | POA: Diagnosis not present

## 2017-08-25 DIAGNOSIS — Z6836 Body mass index (BMI) 36.0-36.9, adult: Secondary | ICD-10-CM | POA: Diagnosis not present

## 2017-08-25 DIAGNOSIS — E1165 Type 2 diabetes mellitus with hyperglycemia: Secondary | ICD-10-CM | POA: Diagnosis not present

## 2017-08-25 DIAGNOSIS — N183 Chronic kidney disease, stage 3 (moderate): Secondary | ICD-10-CM | POA: Diagnosis not present

## 2017-08-25 DIAGNOSIS — J449 Chronic obstructive pulmonary disease, unspecified: Secondary | ICD-10-CM | POA: Diagnosis not present

## 2017-09-02 DIAGNOSIS — J449 Chronic obstructive pulmonary disease, unspecified: Secondary | ICD-10-CM | POA: Diagnosis not present

## 2017-09-02 DIAGNOSIS — E7800 Pure hypercholesterolemia, unspecified: Secondary | ICD-10-CM | POA: Diagnosis not present

## 2017-09-02 DIAGNOSIS — E119 Type 2 diabetes mellitus without complications: Secondary | ICD-10-CM | POA: Diagnosis not present

## 2017-09-02 DIAGNOSIS — M159 Polyosteoarthritis, unspecified: Secondary | ICD-10-CM | POA: Diagnosis not present

## 2017-09-19 DIAGNOSIS — G459 Transient cerebral ischemic attack, unspecified: Secondary | ICD-10-CM | POA: Diagnosis not present

## 2017-09-19 DIAGNOSIS — I208 Other forms of angina pectoris: Secondary | ICD-10-CM | POA: Diagnosis not present

## 2017-09-19 DIAGNOSIS — Z136 Encounter for screening for cardiovascular disorders: Secondary | ICD-10-CM | POA: Diagnosis not present

## 2017-09-19 DIAGNOSIS — R011 Cardiac murmur, unspecified: Secondary | ICD-10-CM | POA: Diagnosis not present

## 2017-09-19 DIAGNOSIS — E785 Hyperlipidemia, unspecified: Secondary | ICD-10-CM | POA: Diagnosis not present

## 2017-09-19 DIAGNOSIS — G4733 Obstructive sleep apnea (adult) (pediatric): Secondary | ICD-10-CM | POA: Diagnosis not present

## 2017-09-19 DIAGNOSIS — R9431 Abnormal electrocardiogram [ECG] [EKG]: Secondary | ICD-10-CM | POA: Diagnosis not present

## 2017-09-19 DIAGNOSIS — R0609 Other forms of dyspnea: Secondary | ICD-10-CM | POA: Diagnosis not present

## 2017-10-01 DIAGNOSIS — M159 Polyosteoarthritis, unspecified: Secondary | ICD-10-CM | POA: Diagnosis not present

## 2017-10-01 DIAGNOSIS — J449 Chronic obstructive pulmonary disease, unspecified: Secondary | ICD-10-CM | POA: Diagnosis not present

## 2017-10-01 DIAGNOSIS — E119 Type 2 diabetes mellitus without complications: Secondary | ICD-10-CM | POA: Diagnosis not present

## 2017-10-01 DIAGNOSIS — E7800 Pure hypercholesterolemia, unspecified: Secondary | ICD-10-CM | POA: Diagnosis not present

## 2017-10-31 DIAGNOSIS — J449 Chronic obstructive pulmonary disease, unspecified: Secondary | ICD-10-CM | POA: Diagnosis not present

## 2017-10-31 DIAGNOSIS — E119 Type 2 diabetes mellitus without complications: Secondary | ICD-10-CM | POA: Diagnosis not present

## 2017-10-31 DIAGNOSIS — E7800 Pure hypercholesterolemia, unspecified: Secondary | ICD-10-CM | POA: Diagnosis not present

## 2017-10-31 DIAGNOSIS — M159 Polyosteoarthritis, unspecified: Secondary | ICD-10-CM | POA: Diagnosis not present

## 2017-11-17 DIAGNOSIS — Z23 Encounter for immunization: Secondary | ICD-10-CM | POA: Diagnosis not present

## 2017-11-28 DIAGNOSIS — N183 Chronic kidney disease, stage 3 (moderate): Secondary | ICD-10-CM | POA: Diagnosis not present

## 2017-11-28 DIAGNOSIS — Z6835 Body mass index (BMI) 35.0-35.9, adult: Secondary | ICD-10-CM | POA: Diagnosis not present

## 2017-11-28 DIAGNOSIS — Z299 Encounter for prophylactic measures, unspecified: Secondary | ICD-10-CM | POA: Diagnosis not present

## 2017-11-28 DIAGNOSIS — E7800 Pure hypercholesterolemia, unspecified: Secondary | ICD-10-CM | POA: Diagnosis not present

## 2017-11-28 DIAGNOSIS — E1122 Type 2 diabetes mellitus with diabetic chronic kidney disease: Secondary | ICD-10-CM | POA: Diagnosis not present

## 2017-11-28 DIAGNOSIS — E1165 Type 2 diabetes mellitus with hyperglycemia: Secondary | ICD-10-CM | POA: Diagnosis not present

## 2017-12-03 DIAGNOSIS — M1712 Unilateral primary osteoarthritis, left knee: Secondary | ICD-10-CM | POA: Diagnosis not present

## 2017-12-03 DIAGNOSIS — Z6835 Body mass index (BMI) 35.0-35.9, adult: Secondary | ICD-10-CM | POA: Diagnosis not present

## 2017-12-03 DIAGNOSIS — Z299 Encounter for prophylactic measures, unspecified: Secondary | ICD-10-CM | POA: Diagnosis not present

## 2017-12-04 DIAGNOSIS — E119 Type 2 diabetes mellitus without complications: Secondary | ICD-10-CM | POA: Diagnosis not present

## 2017-12-04 DIAGNOSIS — M159 Polyosteoarthritis, unspecified: Secondary | ICD-10-CM | POA: Diagnosis not present

## 2017-12-04 DIAGNOSIS — J449 Chronic obstructive pulmonary disease, unspecified: Secondary | ICD-10-CM | POA: Diagnosis not present

## 2017-12-04 DIAGNOSIS — E7800 Pure hypercholesterolemia, unspecified: Secondary | ICD-10-CM | POA: Diagnosis not present

## 2017-12-17 DIAGNOSIS — Z1211 Encounter for screening for malignant neoplasm of colon: Secondary | ICD-10-CM | POA: Diagnosis not present

## 2017-12-17 DIAGNOSIS — Z125 Encounter for screening for malignant neoplasm of prostate: Secondary | ICD-10-CM | POA: Diagnosis not present

## 2017-12-17 DIAGNOSIS — R5383 Other fatigue: Secondary | ICD-10-CM | POA: Diagnosis not present

## 2017-12-17 DIAGNOSIS — E1122 Type 2 diabetes mellitus with diabetic chronic kidney disease: Secondary | ICD-10-CM | POA: Diagnosis not present

## 2017-12-17 DIAGNOSIS — Z1339 Encounter for screening examination for other mental health and behavioral disorders: Secondary | ICD-10-CM | POA: Diagnosis not present

## 2017-12-17 DIAGNOSIS — Z79899 Other long term (current) drug therapy: Secondary | ICD-10-CM | POA: Diagnosis not present

## 2017-12-17 DIAGNOSIS — Z0000 Encounter for general adult medical examination without abnormal findings: Secondary | ICD-10-CM | POA: Diagnosis not present

## 2017-12-17 DIAGNOSIS — Z1331 Encounter for screening for depression: Secondary | ICD-10-CM | POA: Diagnosis not present

## 2017-12-17 DIAGNOSIS — E7800 Pure hypercholesterolemia, unspecified: Secondary | ICD-10-CM | POA: Diagnosis not present

## 2017-12-17 DIAGNOSIS — Z7189 Other specified counseling: Secondary | ICD-10-CM | POA: Diagnosis not present

## 2017-12-17 DIAGNOSIS — Z299 Encounter for prophylactic measures, unspecified: Secondary | ICD-10-CM | POA: Diagnosis not present

## 2017-12-17 DIAGNOSIS — Z6835 Body mass index (BMI) 35.0-35.9, adult: Secondary | ICD-10-CM | POA: Diagnosis not present

## 2017-12-29 DIAGNOSIS — Z6835 Body mass index (BMI) 35.0-35.9, adult: Secondary | ICD-10-CM | POA: Diagnosis not present

## 2017-12-29 DIAGNOSIS — Z299 Encounter for prophylactic measures, unspecified: Secondary | ICD-10-CM | POA: Diagnosis not present

## 2017-12-29 DIAGNOSIS — R195 Other fecal abnormalities: Secondary | ICD-10-CM | POA: Diagnosis not present

## 2017-12-31 DIAGNOSIS — Z86010 Personal history of colonic polyps: Secondary | ICD-10-CM | POA: Diagnosis not present

## 2017-12-31 DIAGNOSIS — R195 Other fecal abnormalities: Secondary | ICD-10-CM | POA: Diagnosis not present

## 2018-01-02 DIAGNOSIS — J449 Chronic obstructive pulmonary disease, unspecified: Secondary | ICD-10-CM | POA: Diagnosis not present

## 2018-01-02 DIAGNOSIS — E119 Type 2 diabetes mellitus without complications: Secondary | ICD-10-CM | POA: Diagnosis not present

## 2018-01-02 DIAGNOSIS — E7800 Pure hypercholesterolemia, unspecified: Secondary | ICD-10-CM | POA: Diagnosis not present

## 2018-01-02 DIAGNOSIS — M159 Polyosteoarthritis, unspecified: Secondary | ICD-10-CM | POA: Diagnosis not present

## 2018-01-05 DIAGNOSIS — K625 Hemorrhage of anus and rectum: Secondary | ICD-10-CM | POA: Diagnosis not present

## 2018-01-05 DIAGNOSIS — Z79899 Other long term (current) drug therapy: Secondary | ICD-10-CM | POA: Diagnosis not present

## 2018-01-05 DIAGNOSIS — K921 Melena: Secondary | ICD-10-CM | POA: Diagnosis not present

## 2018-01-05 DIAGNOSIS — K5730 Diverticulosis of large intestine without perforation or abscess without bleeding: Secondary | ICD-10-CM | POA: Diagnosis not present

## 2018-01-05 DIAGNOSIS — Z86010 Personal history of colonic polyps: Secondary | ICD-10-CM | POA: Diagnosis not present

## 2018-01-05 DIAGNOSIS — G4730 Sleep apnea, unspecified: Secondary | ICD-10-CM | POA: Diagnosis not present

## 2018-01-05 DIAGNOSIS — Z7982 Long term (current) use of aspirin: Secondary | ICD-10-CM | POA: Diagnosis not present

## 2018-01-05 DIAGNOSIS — Z91018 Allergy to other foods: Secondary | ICD-10-CM | POA: Diagnosis not present

## 2018-01-05 DIAGNOSIS — E785 Hyperlipidemia, unspecified: Secondary | ICD-10-CM | POA: Diagnosis not present

## 2018-01-05 DIAGNOSIS — Z883 Allergy status to other anti-infective agents status: Secondary | ICD-10-CM | POA: Diagnosis not present

## 2018-01-05 DIAGNOSIS — Z8673 Personal history of transient ischemic attack (TIA), and cerebral infarction without residual deficits: Secondary | ICD-10-CM | POA: Diagnosis not present

## 2018-01-05 DIAGNOSIS — Z888 Allergy status to other drugs, medicaments and biological substances status: Secondary | ICD-10-CM | POA: Diagnosis not present

## 2018-01-05 DIAGNOSIS — K5790 Diverticulosis of intestine, part unspecified, without perforation or abscess without bleeding: Secondary | ICD-10-CM | POA: Diagnosis not present

## 2018-01-05 DIAGNOSIS — R195 Other fecal abnormalities: Secondary | ICD-10-CM | POA: Diagnosis not present

## 2018-01-19 DIAGNOSIS — R195 Other fecal abnormalities: Secondary | ICD-10-CM | POA: Diagnosis not present

## 2018-03-04 DIAGNOSIS — N183 Chronic kidney disease, stage 3 (moderate): Secondary | ICD-10-CM | POA: Diagnosis not present

## 2018-03-04 DIAGNOSIS — E1165 Type 2 diabetes mellitus with hyperglycemia: Secondary | ICD-10-CM | POA: Diagnosis not present

## 2018-03-04 DIAGNOSIS — Z6835 Body mass index (BMI) 35.0-35.9, adult: Secondary | ICD-10-CM | POA: Diagnosis not present

## 2018-03-04 DIAGNOSIS — Z87891 Personal history of nicotine dependence: Secondary | ICD-10-CM | POA: Diagnosis not present

## 2018-03-04 DIAGNOSIS — E1122 Type 2 diabetes mellitus with diabetic chronic kidney disease: Secondary | ICD-10-CM | POA: Diagnosis not present

## 2018-03-04 DIAGNOSIS — G2581 Restless legs syndrome: Secondary | ICD-10-CM | POA: Diagnosis not present

## 2018-03-04 DIAGNOSIS — Z299 Encounter for prophylactic measures, unspecified: Secondary | ICD-10-CM | POA: Diagnosis not present

## 2018-03-05 DIAGNOSIS — R030 Elevated blood-pressure reading, without diagnosis of hypertension: Secondary | ICD-10-CM | POA: Diagnosis not present

## 2018-03-05 DIAGNOSIS — G4733 Obstructive sleep apnea (adult) (pediatric): Secondary | ICD-10-CM | POA: Diagnosis not present

## 2018-03-06 DIAGNOSIS — E119 Type 2 diabetes mellitus without complications: Secondary | ICD-10-CM | POA: Diagnosis not present

## 2018-03-06 DIAGNOSIS — J449 Chronic obstructive pulmonary disease, unspecified: Secondary | ICD-10-CM | POA: Diagnosis not present

## 2018-03-06 DIAGNOSIS — M159 Polyosteoarthritis, unspecified: Secondary | ICD-10-CM | POA: Diagnosis not present

## 2018-03-06 DIAGNOSIS — E7800 Pure hypercholesterolemia, unspecified: Secondary | ICD-10-CM | POA: Diagnosis not present

## 2018-04-11 DIAGNOSIS — Z7982 Long term (current) use of aspirin: Secondary | ICD-10-CM | POA: Diagnosis not present

## 2018-04-11 DIAGNOSIS — R05 Cough: Secondary | ICD-10-CM | POA: Diagnosis not present

## 2018-04-11 DIAGNOSIS — F329 Major depressive disorder, single episode, unspecified: Secondary | ICD-10-CM | POA: Diagnosis not present

## 2018-04-11 DIAGNOSIS — J441 Chronic obstructive pulmonary disease with (acute) exacerbation: Secondary | ICD-10-CM | POA: Diagnosis not present

## 2018-04-11 DIAGNOSIS — Z79899 Other long term (current) drug therapy: Secondary | ICD-10-CM | POA: Diagnosis not present

## 2018-04-11 DIAGNOSIS — Z87891 Personal history of nicotine dependence: Secondary | ICD-10-CM | POA: Diagnosis not present

## 2018-04-11 DIAGNOSIS — K922 Gastrointestinal hemorrhage, unspecified: Secondary | ICD-10-CM | POA: Diagnosis not present

## 2018-04-11 DIAGNOSIS — Z8673 Personal history of transient ischemic attack (TIA), and cerebral infarction without residual deficits: Secondary | ICD-10-CM | POA: Diagnosis not present

## 2018-04-11 DIAGNOSIS — J029 Acute pharyngitis, unspecified: Secondary | ICD-10-CM | POA: Diagnosis not present

## 2018-04-11 DIAGNOSIS — N189 Chronic kidney disease, unspecified: Secondary | ICD-10-CM | POA: Diagnosis not present

## 2018-04-11 DIAGNOSIS — R195 Other fecal abnormalities: Secondary | ICD-10-CM | POA: Diagnosis not present

## 2018-04-11 DIAGNOSIS — E7800 Pure hypercholesterolemia, unspecified: Secondary | ICD-10-CM | POA: Diagnosis not present

## 2018-04-16 DIAGNOSIS — G4733 Obstructive sleep apnea (adult) (pediatric): Secondary | ICD-10-CM | POA: Diagnosis not present

## 2018-04-16 DIAGNOSIS — R9431 Abnormal electrocardiogram [ECG] [EKG]: Secondary | ICD-10-CM | POA: Diagnosis not present

## 2018-04-16 DIAGNOSIS — E785 Hyperlipidemia, unspecified: Secondary | ICD-10-CM | POA: Diagnosis not present

## 2018-04-16 DIAGNOSIS — G459 Transient cerebral ischemic attack, unspecified: Secondary | ICD-10-CM | POA: Diagnosis not present

## 2018-04-20 DIAGNOSIS — E1122 Type 2 diabetes mellitus with diabetic chronic kidney disease: Secondary | ICD-10-CM | POA: Diagnosis not present

## 2018-04-20 DIAGNOSIS — J449 Chronic obstructive pulmonary disease, unspecified: Secondary | ICD-10-CM | POA: Diagnosis not present

## 2018-04-20 DIAGNOSIS — Z299 Encounter for prophylactic measures, unspecified: Secondary | ICD-10-CM | POA: Diagnosis not present

## 2018-04-20 DIAGNOSIS — Z6835 Body mass index (BMI) 35.0-35.9, adult: Secondary | ICD-10-CM | POA: Diagnosis not present

## 2018-04-20 DIAGNOSIS — E1165 Type 2 diabetes mellitus with hyperglycemia: Secondary | ICD-10-CM | POA: Diagnosis not present

## 2018-04-20 DIAGNOSIS — Z8719 Personal history of other diseases of the digestive system: Secondary | ICD-10-CM | POA: Diagnosis not present

## 2018-05-04 DIAGNOSIS — J449 Chronic obstructive pulmonary disease, unspecified: Secondary | ICD-10-CM | POA: Diagnosis not present

## 2018-05-04 DIAGNOSIS — E7800 Pure hypercholesterolemia, unspecified: Secondary | ICD-10-CM | POA: Diagnosis not present

## 2018-05-04 DIAGNOSIS — E119 Type 2 diabetes mellitus without complications: Secondary | ICD-10-CM | POA: Diagnosis not present

## 2018-05-04 DIAGNOSIS — M159 Polyosteoarthritis, unspecified: Secondary | ICD-10-CM | POA: Diagnosis not present

## 2018-05-29 DIAGNOSIS — R0603 Acute respiratory distress: Secondary | ICD-10-CM | POA: Diagnosis not present

## 2018-05-29 DIAGNOSIS — G4733 Obstructive sleep apnea (adult) (pediatric): Secondary | ICD-10-CM | POA: Diagnosis not present

## 2018-05-29 DIAGNOSIS — J219 Acute bronchiolitis, unspecified: Secondary | ICD-10-CM | POA: Diagnosis not present

## 2018-06-09 DIAGNOSIS — E7800 Pure hypercholesterolemia, unspecified: Secondary | ICD-10-CM | POA: Diagnosis not present

## 2018-06-09 DIAGNOSIS — M159 Polyosteoarthritis, unspecified: Secondary | ICD-10-CM | POA: Diagnosis not present

## 2018-06-09 DIAGNOSIS — E119 Type 2 diabetes mellitus without complications: Secondary | ICD-10-CM | POA: Diagnosis not present

## 2018-06-09 DIAGNOSIS — J449 Chronic obstructive pulmonary disease, unspecified: Secondary | ICD-10-CM | POA: Diagnosis not present

## 2018-06-10 DIAGNOSIS — E1122 Type 2 diabetes mellitus with diabetic chronic kidney disease: Secondary | ICD-10-CM | POA: Diagnosis not present

## 2018-06-10 DIAGNOSIS — N183 Chronic kidney disease, stage 3 (moderate): Secondary | ICD-10-CM | POA: Diagnosis not present

## 2018-06-10 DIAGNOSIS — E1165 Type 2 diabetes mellitus with hyperglycemia: Secondary | ICD-10-CM | POA: Diagnosis not present

## 2018-06-10 DIAGNOSIS — Z299 Encounter for prophylactic measures, unspecified: Secondary | ICD-10-CM | POA: Diagnosis not present

## 2018-06-10 DIAGNOSIS — J449 Chronic obstructive pulmonary disease, unspecified: Secondary | ICD-10-CM | POA: Diagnosis not present

## 2018-06-26 ENCOUNTER — Inpatient Hospital Stay (HOSPITAL_COMMUNITY)
Admission: EM | Admit: 2018-06-26 | Discharge: 2018-07-06 | DRG: 234 | Disposition: A | Discharge status: Home Health | Payer: Medicare Other | Attending: Surgery | Admitting: Surgery

## 2018-06-26 ENCOUNTER — Emergency Department (HOSPITAL_COMMUNITY): Payer: Medicare Other

## 2018-06-26 ENCOUNTER — Inpatient Hospital Stay (HOSPITAL_COMMUNITY)
Admission: EM | Disposition: A | Discharge status: Home Health | Payer: Self-pay | Source: Home / Self Care | Attending: Surgery

## 2018-06-26 ENCOUNTER — Encounter (HOSPITAL_COMMUNITY): Payer: Self-pay | Admitting: Emergency Medicine

## 2018-06-26 ENCOUNTER — Observation Stay (HOSPITAL_BASED_OUTPATIENT_CLINIC_OR_DEPARTMENT_OTHER): Payer: Medicare Other

## 2018-06-26 ENCOUNTER — Other Ambulatory Visit: Payer: Self-pay

## 2018-06-26 DIAGNOSIS — I213 ST elevation (STEMI) myocardial infarction of unspecified site: Secondary | ICD-10-CM | POA: Diagnosis not present

## 2018-06-26 DIAGNOSIS — E669 Obesity, unspecified: Secondary | ICD-10-CM | POA: Diagnosis present

## 2018-06-26 DIAGNOSIS — E785 Hyperlipidemia, unspecified: Secondary | ICD-10-CM | POA: Diagnosis not present

## 2018-06-26 DIAGNOSIS — I472 Ventricular tachycardia: Secondary | ICD-10-CM | POA: Diagnosis not present

## 2018-06-26 DIAGNOSIS — N179 Acute kidney failure, unspecified: Secondary | ICD-10-CM | POA: Diagnosis not present

## 2018-06-26 DIAGNOSIS — I959 Hypotension, unspecified: Secondary | ICD-10-CM | POA: Diagnosis not present

## 2018-06-26 DIAGNOSIS — I351 Nonrheumatic aortic (valve) insufficiency: Secondary | ICD-10-CM

## 2018-06-26 DIAGNOSIS — H9190 Unspecified hearing loss, unspecified ear: Secondary | ICD-10-CM | POA: Diagnosis present

## 2018-06-26 DIAGNOSIS — Z20828 Contact with and (suspected) exposure to other viral communicable diseases: Secondary | ICD-10-CM | POA: Diagnosis not present

## 2018-06-26 DIAGNOSIS — I340 Nonrheumatic mitral (valve) insufficiency: Secondary | ICD-10-CM

## 2018-06-26 DIAGNOSIS — Z87891 Personal history of nicotine dependence: Secondary | ICD-10-CM

## 2018-06-26 DIAGNOSIS — I4510 Unspecified right bundle-branch block: Secondary | ICD-10-CM | POA: Diagnosis not present

## 2018-06-26 DIAGNOSIS — I2510 Atherosclerotic heart disease of native coronary artery without angina pectoris: Secondary | ICD-10-CM

## 2018-06-26 DIAGNOSIS — R0602 Shortness of breath: Secondary | ICD-10-CM | POA: Diagnosis not present

## 2018-06-26 DIAGNOSIS — R079 Chest pain, unspecified: Secondary | ICD-10-CM | POA: Diagnosis not present

## 2018-06-26 DIAGNOSIS — I951 Orthostatic hypotension: Secondary | ICD-10-CM | POA: Diagnosis not present

## 2018-06-26 DIAGNOSIS — I25110 Atherosclerotic heart disease of native coronary artery with unstable angina pectoris: Secondary | ICD-10-CM | POA: Diagnosis present

## 2018-06-26 DIAGNOSIS — Z7982 Long term (current) use of aspirin: Secondary | ICD-10-CM

## 2018-06-26 DIAGNOSIS — Z91018 Allergy to other foods: Secondary | ICD-10-CM

## 2018-06-26 DIAGNOSIS — I2584 Coronary atherosclerosis due to calcified coronary lesion: Secondary | ICD-10-CM | POA: Diagnosis present

## 2018-06-26 DIAGNOSIS — E7800 Pure hypercholesterolemia, unspecified: Secondary | ICD-10-CM | POA: Diagnosis present

## 2018-06-26 DIAGNOSIS — M1990 Unspecified osteoarthritis, unspecified site: Secondary | ICD-10-CM | POA: Diagnosis not present

## 2018-06-26 DIAGNOSIS — G4730 Sleep apnea, unspecified: Secondary | ICD-10-CM | POA: Diagnosis not present

## 2018-06-26 DIAGNOSIS — D62 Acute posthemorrhagic anemia: Secondary | ICD-10-CM | POA: Diagnosis not present

## 2018-06-26 DIAGNOSIS — E8779 Other fluid overload: Secondary | ICD-10-CM | POA: Diagnosis not present

## 2018-06-26 DIAGNOSIS — I200 Unstable angina: Secondary | ICD-10-CM | POA: Diagnosis present

## 2018-06-26 DIAGNOSIS — N183 Chronic kidney disease, stage 3 (moderate): Secondary | ICD-10-CM

## 2018-06-26 DIAGNOSIS — I440 Atrioventricular block, first degree: Secondary | ICD-10-CM | POA: Diagnosis not present

## 2018-06-26 DIAGNOSIS — Z951 Presence of aortocoronary bypass graft: Secondary | ICD-10-CM

## 2018-06-26 DIAGNOSIS — K219 Gastro-esophageal reflux disease without esophagitis: Secondary | ICD-10-CM | POA: Diagnosis not present

## 2018-06-26 DIAGNOSIS — E875 Hyperkalemia: Secondary | ICD-10-CM | POA: Diagnosis present

## 2018-06-26 DIAGNOSIS — Z6833 Body mass index (BMI) 33.0-33.9, adult: Secondary | ICD-10-CM

## 2018-06-26 DIAGNOSIS — R0902 Hypoxemia: Secondary | ICD-10-CM | POA: Diagnosis not present

## 2018-06-26 DIAGNOSIS — I214 Non-ST elevation (NSTEMI) myocardial infarction: Principal | ICD-10-CM

## 2018-06-26 DIAGNOSIS — Z881 Allergy status to other antibiotic agents status: Secondary | ICD-10-CM

## 2018-06-26 DIAGNOSIS — Z1159 Encounter for screening for other viral diseases: Secondary | ICD-10-CM

## 2018-06-26 DIAGNOSIS — Z79899 Other long term (current) drug therapy: Secondary | ICD-10-CM

## 2018-06-26 DIAGNOSIS — R7303 Prediabetes: Secondary | ICD-10-CM | POA: Diagnosis present

## 2018-06-26 DIAGNOSIS — J90 Pleural effusion, not elsewhere classified: Secondary | ICD-10-CM

## 2018-06-26 HISTORY — DX: Gastro-esophageal reflux disease without esophagitis: K21.9

## 2018-06-26 HISTORY — PX: LEFT HEART CATH AND CORONARY ANGIOGRAPHY: CATH118249

## 2018-06-26 HISTORY — DX: Pure hypercholesterolemia, unspecified: E78.00

## 2018-06-26 LAB — CBC WITH DIFFERENTIAL/PLATELET
Abs Immature Granulocytes: 0.05 10*3/uL (ref 0.00–0.07)
Basophils Absolute: 0.1 10*3/uL (ref 0.0–0.1)
Basophils Relative: 1 %
Eosinophils Absolute: 0.5 10*3/uL (ref 0.0–0.5)
Eosinophils Relative: 5 %
HCT: 44.3 % (ref 39.0–52.0)
Hemoglobin: 14.4 g/dL (ref 13.0–17.0)
Immature Granulocytes: 1 %
Lymphocytes Relative: 23 %
Lymphs Abs: 2.5 10*3/uL (ref 0.7–4.0)
MCH: 29.4 pg (ref 26.0–34.0)
MCHC: 32.5 g/dL (ref 30.0–36.0)
MCV: 90.6 fL (ref 80.0–100.0)
Monocytes Absolute: 0.8 10*3/uL (ref 0.1–1.0)
Monocytes Relative: 8 %
Neutro Abs: 6.6 10*3/uL (ref 1.7–7.7)
Neutrophils Relative %: 62 %
Platelets: 272 10*3/uL (ref 150–400)
RBC: 4.89 MIL/uL (ref 4.22–5.81)
RDW: 13.5 % (ref 11.5–15.5)
WBC: 10.5 10*3/uL (ref 4.0–10.5)
nRBC: 0 % (ref 0.0–0.2)

## 2018-06-26 LAB — BASIC METABOLIC PANEL
Anion gap: 12 (ref 5–15)
BUN: 16 mg/dL (ref 8–23)
CO2: 21 mmol/L — ABNORMAL LOW (ref 22–32)
Calcium: 9.1 mg/dL (ref 8.9–10.3)
Chloride: 104 mmol/L (ref 98–111)
Creatinine, Ser: 1.63 mg/dL — ABNORMAL HIGH (ref 0.61–1.24)
GFR calc Af Amer: 42 mL/min — ABNORMAL LOW (ref >60)
GFR calc non Af Amer: 37 mL/min — ABNORMAL LOW (ref >60)
Glucose, Bld: 131 mg/dL — ABNORMAL HIGH (ref 70–99)
Potassium: 4.6 mmol/L (ref 3.5–5.1)
Sodium: 137 mmol/L (ref 135–145)

## 2018-06-26 LAB — ECHOCARDIOGRAM COMPLETE
Height: 68 in
Weight: 3612.8 oz

## 2018-06-26 LAB — PROTIME-INR
INR: 1.2 (ref 0.8–1.2)
Prothrombin Time: 14.6 seconds (ref 11.4–15.2)

## 2018-06-26 LAB — LIPID PANEL
Cholesterol: 164 mg/dL (ref 0–200)
HDL: 51 mg/dL (ref >40)
LDL Cholesterol: 89 mg/dL (ref 0–99)
Total CHOL/HDL Ratio: 3.2 RATIO
Triglycerides: 118 mg/dL (ref <150)
VLDL: 24 mg/dL (ref 0–40)

## 2018-06-26 LAB — TROPONIN I
Troponin I: 0.21 ng/mL (ref <0.03)
Troponin I: 3 ng/mL (ref <0.03)

## 2018-06-26 LAB — POCT ACTIVATED CLOTTING TIME: Activated Clotting Time: 131 seconds

## 2018-06-26 LAB — HEPARIN LEVEL (UNFRACTIONATED): Heparin Unfractionated: 0.17 IU/mL — ABNORMAL LOW (ref 0.30–0.70)

## 2018-06-26 LAB — APTT: aPTT: 72 seconds — ABNORMAL HIGH (ref 24–36)

## 2018-06-26 LAB — SARS CORONAVIRUS 2 BY RT PCR (HOSPITAL ORDER, PERFORMED IN ~~LOC~~ HOSPITAL LAB): SARS Coronavirus 2: NEGATIVE

## 2018-06-26 SURGERY — LEFT HEART CATH AND CORONARY ANGIOGRAPHY
Anesthesia: LOCAL

## 2018-06-26 MED ORDER — SODIUM CHLORIDE 0.9% FLUSH: 3.0000 mL | INTRAVENOUS | Status: DC | PRN

## 2018-06-26 MED ORDER — METOPROLOL TARTRATE 12.5 MG HALF TABLET
12.5000 mg | ORAL_TABLET | Freq: Two times a day (BID) | ORAL | Status: DC
  Administered 2018-06-26 (×2): 12.5 mg via ORAL
  Filled 2018-06-26 (×2): qty 1

## 2018-06-26 MED ORDER — LABETALOL HCL 5 MG/ML IV SOLN: 10.0000 mg | INTRAVENOUS | Status: AC | PRN

## 2018-06-26 MED ORDER — SODIUM CHLORIDE 0.9 % IV SOLN: 250.0000 mL | INTRAVENOUS | Status: DC | PRN

## 2018-06-26 MED ORDER — ACETAMINOPHEN 325 MG PO TABS: 650.0000 mg | ORAL_TABLET | ORAL | Status: DC | PRN

## 2018-06-26 MED ORDER — LIDOCAINE HCL (PF) 1 % IJ SOLN
INTRAMUSCULAR | Status: DC | PRN
  Administered 2018-06-26: 2 mL
  Administered 2018-06-26: 18 mL

## 2018-06-26 MED ORDER — NITROGLYCERIN 0.4 MG SL SUBL: 0.4000 mg | SUBLINGUAL_TABLET | SUBLINGUAL | Status: DC | PRN

## 2018-06-26 MED ORDER — IOHEXOL 350 MG/ML SOLN
INTRAVENOUS | Status: DC | PRN
  Administered 2018-06-26: 70 mL via INTRA_ARTERIAL

## 2018-06-26 MED ORDER — ASPIRIN EC 81 MG PO TBEC
81.0000 mg | DELAYED_RELEASE_TABLET | Freq: Every day | ORAL | Status: DC
  Administered 2018-06-26: 11:00:00 81 mg via ORAL
  Filled 2018-06-26: qty 1

## 2018-06-26 MED ORDER — SODIUM CHLORIDE 0.9% FLUSH
3.0000 mL | Freq: Two times a day (BID) | INTRAVENOUS | Status: DC
  Administered 2018-06-26 – 2018-06-27 (×2): 3 mL via INTRAVENOUS

## 2018-06-26 MED ORDER — HEPARIN (PORCINE) IN NACL 1000-0.9 UT/500ML-% IV SOLN
INTRAVENOUS | Status: DC | PRN
  Administered 2018-06-26 (×2): 500 mL

## 2018-06-26 MED ORDER — SODIUM CHLORIDE 0.9 % WEIGHT BASED INFUSION
3.0000 mL/kg/h | INTRAVENOUS | Status: DC
  Administered 2018-06-26: 10:00:00 3 mL/kg/h via INTRAVENOUS

## 2018-06-26 MED ORDER — ASPIRIN EC 81 MG PO TBEC: 81.0000 mg | DELAYED_RELEASE_TABLET | Freq: Every day | ORAL | Status: DC

## 2018-06-26 MED ORDER — ATORVASTATIN CALCIUM 80 MG PO TABS
80.0000 mg | ORAL_TABLET | Freq: Every day | ORAL | Status: DC
  Administered 2018-06-26 – 2018-07-05 (×9): 80 mg via ORAL
  Filled 2018-06-26 (×9): qty 1

## 2018-06-26 MED ORDER — OXYCODONE HCL 5 MG PO TABS: 5.0000 mg | ORAL_TABLET | ORAL | Status: DC | PRN

## 2018-06-26 MED ORDER — SODIUM CHLORIDE 0.9 % IV SOLN
INTRAVENOUS | Status: AC
  Administered 2018-06-26: 17:00:00 via INTRAVENOUS

## 2018-06-26 MED ORDER — SODIUM CHLORIDE 0.9 % WEIGHT BASED INFUSION
1.0000 mL/kg/h | INTRAVENOUS | Status: DC
  Administered 2018-06-26: 11:00:00 1 mL/kg/h via INTRAVENOUS

## 2018-06-26 MED ORDER — LIDOCAINE HCL (PF) 1 % IJ SOLN
INTRAMUSCULAR | Status: AC
  Filled 2018-06-26: qty 30

## 2018-06-26 MED ORDER — SODIUM CHLORIDE 0.9% FLUSH
3.0000 mL | Freq: Two times a day (BID) | INTRAVENOUS | Status: DC
  Administered 2018-06-26 – 2018-06-28 (×4): 3 mL via INTRAVENOUS

## 2018-06-26 MED ORDER — HEPARIN SODIUM (PORCINE) 1000 UNIT/ML IJ SOLN
INTRAMUSCULAR | Status: AC
  Filled 2018-06-26: qty 1

## 2018-06-26 MED ORDER — ONDANSETRON HCL 4 MG/2ML IJ SOLN: 4.0000 mg | Freq: Four times a day (QID) | INTRAMUSCULAR | Status: DC | PRN

## 2018-06-26 MED ORDER — MIDAZOLAM HCL 2 MG/2ML IJ SOLN
INTRAMUSCULAR | Status: AC
  Filled 2018-06-26: qty 2

## 2018-06-26 MED ORDER — MIDAZOLAM HCL 2 MG/2ML IJ SOLN
INTRAMUSCULAR | Status: DC | PRN
  Administered 2018-06-26: 0.5 mg via INTRAVENOUS

## 2018-06-26 MED ORDER — ASPIRIN 81 MG PO CHEW
81.0000 mg | CHEWABLE_TABLET | Freq: Every day | ORAL | Status: DC
  Administered 2018-06-26 – 2018-06-28 (×3): 81 mg via ORAL
  Filled 2018-06-26 (×3): qty 1

## 2018-06-26 MED ORDER — HEPARIN BOLUS VIA INFUSION
1350.0000 [IU] | Freq: Once | INTRAVENOUS | Status: AC
  Administered 2018-06-26: 13:00:00 1350 [IU] via INTRAVENOUS
  Filled 2018-06-26: qty 1350

## 2018-06-26 MED ORDER — HEPARIN (PORCINE) IN NACL 1000-0.9 UT/500ML-% IV SOLN
INTRAVENOUS | Status: AC
  Filled 2018-06-26: qty 1000

## 2018-06-26 MED ORDER — FENTANYL CITRATE (PF) 100 MCG/2ML IJ SOLN
INTRAMUSCULAR | Status: DC | PRN
  Administered 2018-06-26: 25 ug via INTRAVENOUS

## 2018-06-26 MED ORDER — HYDRALAZINE HCL 20 MG/ML IJ SOLN: 10.0000 mg | INTRAMUSCULAR | Status: AC | PRN

## 2018-06-26 MED ORDER — FENTANYL CITRATE (PF) 100 MCG/2ML IJ SOLN
INTRAMUSCULAR | Status: AC
  Filled 2018-06-26: qty 2

## 2018-06-26 MED ORDER — HEPARIN (PORCINE) 25000 UT/250ML-% IV SOLN: 1400.0000 [IU]/h | INTRAVENOUS | Status: DC

## 2018-06-26 MED ORDER — VERAPAMIL HCL 2.5 MG/ML IV SOLN
INTRAVENOUS | Status: AC
  Filled 2018-06-26: qty 2

## 2018-06-26 MED ORDER — VERAPAMIL HCL 2.5 MG/ML IV SOLN
INTRAVENOUS | Status: DC | PRN
  Administered 2018-06-26: 15:00:00 via INTRA_ARTERIAL

## 2018-06-26 MED ORDER — HEPARIN (PORCINE) 25000 UT/250ML-% IV SOLN
1400.0000 [IU]/h | INTRAVENOUS | Status: DC
  Administered 2018-06-26: 05:00:00 1100 [IU]/h via INTRAVENOUS
  Filled 2018-06-26: qty 250

## 2018-06-26 SURGICAL SUPPLY — 16 items
CATH 5FR JL3.5 JR4 ANG PIG MP (CATHETERS) ×2 IMPLANT
CATH INFINITI 5FR JL4 (CATHETERS) ×2 IMPLANT
CATH INFINITI 5FR JL5 (CATHETERS) ×2 IMPLANT
COVER DOME SNAP 22 D (MISCELLANEOUS) ×2 IMPLANT
DEVICE RAD COMP TR BAND LRG (VASCULAR PRODUCTS) ×2 IMPLANT
GLIDESHEATH SLEND A-KIT 6F 22G (SHEATH) ×2 IMPLANT
GUIDEWIRE INQWIRE 1.5J.035X260 (WIRE) ×1 IMPLANT
INQWIRE 1.5J .035X260CM (WIRE) ×2
KIT HEART LEFT (KITS) ×2 IMPLANT
PACK CARDIAC CATHETERIZATION (CUSTOM PROCEDURE TRAY) ×2 IMPLANT
SHEATH PINNACLE 5F 10CM (SHEATH) ×2 IMPLANT
SHEATH PROBE COVER 6X72 (BAG) ×2 IMPLANT
TRANSDUCER W/STOPCOCK (MISCELLANEOUS) ×2 IMPLANT
TUBING CIL FLEX 10 FLL-RA (TUBING) ×2 IMPLANT
WIRE EMERALD 3MM-J .035X150CM (WIRE) ×2 IMPLANT
WIRE HI TORQ VERSACORE-J 145CM (WIRE) ×2 IMPLANT

## 2018-06-26 NOTE — H&P (View-Only) (Signed)
Progress Note  Patient Name: John Palmer Date of Encounter: 06/26/2018  Primary Cardiologist: No primary care provider on file. new  Subjective   Feels well this am. No chest pain.  Inpatient Medications    Scheduled Meds: . [START ON 06/27/2018] aspirin EC  81 mg Oral Daily  . atorvastatin  80 mg Oral q1800  . metoprolol tartrate  12.5 mg Oral BID   Continuous Infusions: . heparin 1,100 Units/hr (06/26/18 0515)   PRN Meds: acetaminophen, nitroGLYCERIN, ondansetron (ZOFRAN) IV   Vital Signs    Vitals:   06/26/18 0315 06/26/18 0330 06/26/18 0348 06/26/18 0415  BP: 121/70 (!) 120/59 113/70 131/76  Pulse: 67  68 73  Resp: 13  16 15   Temp:   (!) 97.5 F (36.4 C) 97.7 F (36.5 C)  TempSrc:   Temporal Oral  SpO2: 95%  100% 95%  Weight:    102.4 kg  Height:    5\' 8"  (1.727 m)    Intake/Output Summary (Last 24 hours) at 06/26/2018 0859 Last data filed at 06/26/2018 0515 Gross per 24 hour  Intake 3.87 ml  Output -  Net 3.87 ml   Last 3 Weights 06/26/2018 06/26/2018  Weight (lbs) 225 lb 12.8 oz 220 lb  Weight (kg) 102.422 kg 99.791 kg      Telemetry    NSR with rare PVC- Personally Reviewed  ECG    NSR with RBBB, LPFB. Since yesterday inferolateral ST depression resolved.  - Personally Reviewed  Physical Exam   GEN: No acute distress.  Elderly WM appears younger than stated age.  Neck: No JVD, he is hard of hearing Cardiac: RRR, no murmurs, rubs, or gallops. Pulses good throughout.  Respiratory: Clear to auscultation bilaterally. GI: Soft, nontender, non-distended  MS: No edema; No deformity. Neuro:  Nonfocal  Psych: Normal affect   Labs    Chemistry Recent Labs  Lab 06/26/18 0252  NA 137  K 4.6  CL 104  CO2 21*  GLUCOSE 131*  BUN 16  CREATININE 1.63*  CALCIUM 9.1  GFRNONAA 37*  GFRAA 42*  ANIONGAP 12     Hematology Recent Labs  Lab 06/26/18 0252  WBC 10.5  RBC 4.89  HGB 14.4  HCT 44.3  MCV 90.6  MCH 29.4  MCHC 32.5  RDW  13.5  PLT 272    Cardiac Enzymes Recent Labs  Lab 06/26/18 0252  TROPONINI 0.21*   No results for input(s): TROPIPOC in the last 168 hours.   BNPNo results for input(s): BNP, PROBNP in the last 168 hours.   DDimer No results for input(s): DDIMER in the last 168 hours.   Radiology    Dg Chest Port 1 View  Result Date: 06/26/2018 CLINICAL DATA:  Chest pain EXAM: PORTABLE CHEST 1 VIEW COMPARISON:  06/26/2018 FINDINGS: Cardiac shadow is mildly prominent but accentuated by the portable technique. Aortic calcifications are noted. Lungs are well aerated bilaterally. No focal infiltrate or sizable effusion is seen. No acute abnormality is noted. IMPRESSION: No acute abnormality noted. Electronically Signed   By: John Palmer M.D.   On: 06/26/2018 03:06    Cardiac Studies   none  Patient Profile     85 y.o. male from [place] with history of HLD presents with NSTEMI  Assessment & Plan    1. NSTEMI. Troponin 0.21. Ecg with dynamic inferolateral ST depression. Currently pain free on IV heparin. No prior cardiac history. States he had severe chest and left arm pain for 2-3 hours. Will  continue ASA. Add beta blocker. I discussed with him at length concerning conservative medical approach versus invasive approach with cardiac cath and possible PCI. He considers himself to be in excellent health and is very active. He would prefer an invasive approach. We discussed concern about his kidney function. Will hydrate for 3 hours prior to cath. The procedure and risks were reviewed including but not limited to death, myocardial infarction, stroke, arrythmias, bleeding, transfusion, emergency surgery, dye allergy, or renal dysfunction. The patient voices understanding and is agreeable to proceed. 2. HLD - reports being on cholesterol therapy at home. Will check lipid panel. On high dose lipitor now. 3. CKD stage 3. Patient states he was never told he had kidney problems. Sees Dr Manuella Ghazi in [place]. Will  request old labs to review. 4. RBBB,LPFB  For questions or updates, please contact [place] Please consult [email or web address] for contact info under    Labs reviewed from primary care. Creatinine was 1.71 in November. Records placed in paper chart     Signed, John Pickney [place], MD  06/26/2018, 8:59 AM

## 2018-06-26 NOTE — Progress Notes (Signed)
ANTICOAGULATION CONSULT NOTE - Initial Consult  Pharmacy Consult for heparin Indication: chest pain/ACS  Allergies  Allergen Reactions  . Banana   . Levofloxacin     Patient Measurements: Height: 5\' 8"  (172.7 cm) Weight: 225 lb 12.8 oz (102.4 kg) IBW/kg (Calculated) : 68.4 Heparin Dosing Weight: 90.9 kg  Vital Signs: Temp: 97.7 F (36.5 C) (05/29 0415) Temp Source: Oral (05/29 0415) BP: 131/76 (05/29 0415) Pulse Rate: 73 (05/29 0415)  Labs: Recent Labs    06/26/18 0252  HGB 14.4  HCT 44.3  PLT 272  APTT 72*  LABPROT 14.6  INR 1.2  CREATININE 1.63*  TROPONINI 0.21*    Estimated Creatinine Clearance: 34.9 mL/min (A) (by C-G formula based on SCr of 1.63 mg/dL (H)).   Medical History: Past Medical History:  Diagnosis Date  . GERD (gastroesophageal reflux disease)   . Hypercholesterolemia     Medications:  See medication history  Assessment: 85 yo man transferred from [facility] to continue heparin drip for ACS.  He was given 5000 units heparin bolus @ ~ 01:30 and drip was started at unknown rate.  Heparin is currently not infusing.  He was not on anticoagulation PTA Goal of Therapy:  Heparin level 0.3-0.7 units/ml Monitor platelets by anticoagulation protocol: Yes   Plan:  Restart heparin at 1100 units/hr Check heparin level in 6 hours Daily HL and CBC while on heparin Monitor for bleeding complications  Thanks for allowing pharmacy to be a part of this patient's care.  Excell Seltzer, PharmD Clinical Pharmacist 06/26/2018,4:24 AM

## 2018-06-26 NOTE — Interval H&P Note (Signed)
Cath Lab Visit (complete for each Cath Lab visit)  Clinical Evaluation Leading to the Procedure:   ACS: Yes.    Non-ACS:    Anginal Classification: CCS III  Anti-ischemic medical therapy: Minimal Therapy (1 class of medications)  Non-Invasive Test Results: No non-invasive testing performed  Prior CABG: No previous CABG      History and Physical Interval Note:  06/26/2018 2:05 PM  John Palmer  has presented today for surgery, with the diagnosis of Non stemi.  The various methods of treatment have been discussed with the patient and family. After consideration of risks, benefits and other options for treatment, the patient has consented to  Procedure(s): LEFT HEART CATH AND CORONARY ANGIOGRAPHY (N/A) as a surgical intervention.  The patient's history has been reviewed, patient examined, no change in status, stable for surgery.  I have reviewed the patient's chart and labs.  Questions were answered to the patient's satisfaction.     Belva Crome III

## 2018-06-26 NOTE — ED Notes (Signed)
ED TO INPATIENT HANDOFF REPORT  ED Nurse Name and Phone #:  Clydene Laming [address] Name/Age/Gender John Palmer 85 y.o. male Room/Bed: TRACC/TRACC  Code Status : Full   Home/SNF/Other : Home  Patient oriented X4 . Is this baseline? Yes  Triage Complete: Triage complete  Chief Complaint stemi transfer  Triage Note Patient arrived with EMS from [place] , left chest pain onset yesterday afternoon radiating to left arm transferred here for admission , he received ASA , Metropolol 5 mg IV and heparin 5000 units IV prior to arrival . Denies chest pain at arrival / respirations unlabore d.    Allergies Allergies  Allergen Reactions  . Banana   . Levofloxacin     Level of Care/Admitting Diagnosis ED Disposition    ED Disposition Condition Comment   Admit  The patient appears reasonably stabilized for admission considering the current resources, flow, and capabilities available in the ED at this time, and I doubt any other [facility] requiring further screening and/or treatment in the ED prior to admission is  present.       B Medical/Surgery History Past Medical History:  Diagnosis Date  . GERD (gastroesophageal reflux disease)   . Hypercholesterolemia       A IV Location/Drains/Wounds Patient Lines/Drains/Airways Status   Active Line/Drains/Airways    Name:   Placement date:   Placement time:   Site:   Days:   Peripheral IV 06/26/18 Left Antecubital   06/26/18    -    Antecubital   less than 1   Peripheral IV 06/26/18 Right Antecubital   06/26/18    -    Antecubital   less than 1          Intake/Output Last 24 hours No intake or output data in the 24 hours ending 06/26/18 0320  Labs/Imaging Results for orders placed or performed during the hospital encounter of 06/26/18 (from the past 48 hour(s))  CBC with Differential/Platelet     Status: None   Collection Time: 06/26/18  2:52 AM  Result Value Ref Range   WBC 10.5 4.0 - 10.5 K/uL   RBC 4.89 4.22 -  5.81 MIL/uL   Hemoglobin 14.4 13.0 - 17.0 g/dL   HCT 44.3 39.0 - 52.0 %   MCV 90.6 80.0 - 100.0 fL   MCH 29.4 26.0 - 34.0 pg   MCHC 32.5 30.0 - 36.0 g/dL   RDW 13.5 11.5 - 15.5 %   Platelets 272 150 - 400 K/uL   nRBC 0.0 0.0 - 0.2 %   Neutrophils Relative % 62 %   Neutro Abs 6.6 1.7 - 7.7 K/uL   Lymphocytes Relative 23 %   Lymphs Abs 2.5 0.7 - 4.0 K/uL   Monocytes Relative 8 %   Monocytes Absolute 0.8 0.1 - 1.0 K/uL   Eosinophils Relative 5 %   Eosinophils Absolute 0.5 0.0 - 0.5 K/uL   Basophils Relative 1 %   Basophils Absolute 0.1 0.0 - 0.1 K/uL   Immature Granulocytes 1 %   Abs Immature Granulocytes 0.05 0.00 - 0.07 K/uL    Comment: Performed at [facility], [address], [place], [place] [address]  APTT     Status: Abnormal   Collection Time: 06/26/18  2:52 AM  Result Value Ref Range   aPTT 72 (H) 24 - 36 seconds    Comment:        IF BASELINE aPTT IS ELEVATED, SUGGEST PATIENT RISK ASSESSMENT BE  USED TO DETERMINE APPROPRIATE ANTICOAGULANT THERAPY. Performed at [facility], [place] [address], [place], [place] [address]   Protime-INR     Status: None   Collection Time: 06/26/18  2:52 AM  Result Value Ref Range   Prothrombin Time 14.6 11.4 - 15.2 seconds   INR 1.2 0.8 - 1.2    Comment: (NOTE) INR goal varies based on device and disease states. Performed at [facility], [place] [address], [place], [place] [address]    Dg Chest Port 1 View  Result Date: 06/26/2018 CLINICAL DATA:  Chest pain EXAM: PORTABLE CHEST 1 VIEW COMPARISON:  06/26/2018 FINDINGS: Cardiac shadow is mildly prominent but accentuated by the portable technique. Aortic calcifications are noted. Lungs are well aerated bilaterally. No focal infiltrate or sizable effusion is seen. No acute abnormality is noted. IMPRESSION: No acute abnormality noted. Electronically Signed   By: Inez Catalina M.D.   On: 06/26/2018 03:06    Pending Labs Unresulted Labs (From admission, onward)    Start      Ordered   06/26/18 0248  SARS Coronavirus 2 (CEPHEID - Performed in [facility] hospital lab), Hosp Order  (Asymptomatic Patients Labs)  Once,   R    Question:  Rule Out  Answer:  Yes   06/26/18 0247   06/26/18 0277  Basic metabolic panel  ONCE - STAT,   STAT     06/26/18 0246   06/26/18 0247  Troponin I - ONCE - STAT  ONCE - STAT,   STAT     06/26/18 0246          Vitals/Pain Today's Vitals   06/26/18 0243 06/26/18 0245 06/26/18 0300 06/26/18 0302  BP:  114/61 122/74   Pulse:  82 84   Resp:  (!) 26 (!) 23   SpO2:  95% 94%   Weight: 99.8 kg     Height: 5\' 8"  (1.727 m)     PainSc: 0-No pain   0-No pain    Isolation Precautions No active isolations  Medications Medications - No data to display  Mobility walks Low fall risk   Focused Assessments Family notified on transfer prior to arrival.    R Recommendations: See Admitting Provider Note  Report given to:   Additional Notes:

## 2018-06-26 NOTE — ED Provider Notes (Signed)
[facility] EMERGENCY DEPARTMENT Provider Note   CSN: [id number] Arrival date & time: 06/26/18  5465    History   Chief Complaint Chief Complaint  Patient presents with  . Chest Pain/STEMI   Level 5 caveat due to acuity of condition HPI John Palmer is a 85 y.o. male.     The history is provided by the patient and the EMS personnel. The history is limited by the condition of the patient.  Chest Pain  Pain location:  L chest Pain quality: aching   Pain radiates to:  L arm Pain severity:  Severe Onset quality:  Sudden Timing:  Constant Progression:  Improving Chronicity:  New Relieved by:  Nothing Worsened by:  Nothing Associated symptoms: shortness of breath   Associated symptoms: no fever and no vomiting    Patient presents as an urgent transfer from an outside hospital.  Patient was sent for concern for STEMI.  He reports onset of chest pain yesterday.  No other details known on arrival Past Medical History:  Diagnosis Date  . GERD (gastroesophageal reflux disease)   . Hypercholesterolemia     There are no active problems to display for this patient.    Home Medications    Prior to Admission medications   Not on File    Family History No family history on file.  Social History Social History   Tobacco Use  . Smoking status: Never Smoker  . Smokeless tobacco: Never Used  Substance Use Topics  . Alcohol use: Yes  . Drug use: Never     Allergies   Banana and Levofloxacin   Review of Systems Review of Systems  Unable to perform ROS: Acuity of condition  Constitutional: Negative for fever.  Respiratory: Positive for shortness of breath.   Cardiovascular: Positive for chest pain.  Gastrointestinal: Negative for vomiting.     Physical Exam Updated Vital Signs BP 122/74   Pulse 84   Resp (!) 23   Ht 1.727 m (5\' 8" )   Wt 99.8 kg   SpO2 94%   BMI 33.45 kg/m   Physical Exam  CONSTITUTIONAL: Elderly, appears  younger than stated age HEAD: Normocephalic/atraumatic EYES: EOMI ENMT: Mucous membranes moist NECK: supple no meningeal signs SPINE/BACK:entire spine nontender CV: S1/S2 noted, no murmurs/rubs/gallops noted LUNGS: Lungs are clear to auscultation bilaterally, no apparent distress ABDOMEN: soft, nontender, obese NEURO: Pt is awake/alert/appropriate, moves all extremitiesx4.  No facial droop.   EXTREMITIES: pulses normal/equalx4 full ROM SKIN: warm, color normal PSYCH: no abnormalities of mood noted, alert and oriented to situation  ED Treatments / Results  Labs (all labs ordered are listed, but only abnormal results are displayed) Labs Reviewed  APTT - Abnormal; Notable for the following components:      Result Value   aPTT 72 (*)    All other components within normal limits  SARS CORONAVIRUS 2 (HOSPITAL ORDER, [facility])  CBC WITH DIFFERENTIAL/PLATELET  PROTIME-INR  BASIC METABOLIC PANEL  TROPONIN I    EKG EKG Interpretation  Date/Time:  Friday Jun 26 2018 02:45:34 EDT Ventricular Rate:  79 PR Interval:    QRS Duration: 156 QT Interval:  405 QTC Calculation: 465 R Axis:   91 Text Interpretation:  Sinus rhythm Prolonged PR interval RBBB and LPFB ST depr, consider ischemia, inferior leads Abnormal ekg No previous ECGs available Confirmed by Ripley Fraise ([id number]) on 06/26/2018 2:48:23 AM   Radiology Dg Chest Port 1 View  Result Date: 06/26/2018 CLINICAL  DATA:  Chest pain EXAM: PORTABLE CHEST 1 VIEW COMPARISON:  06/26/2018 FINDINGS: Cardiac shadow is mildly prominent but accentuated by the portable technique. Aortic calcifications are noted. Lungs are well aerated bilaterally. No focal infiltrate or sizable effusion is seen. No acute abnormality is noted. IMPRESSION: No acute abnormality noted. Electronically Signed   By: Inez Catalina M.D.   On: 06/26/2018 03:06    Procedures Procedures   CRITICAL CARE Performed by: Sharyon Cable  Total critical care time: 30 minutes Critical care time was exclusive of separately billable procedures and treating other patients. Critical care was necessary to treat or prevent imminent or life-threatening deterioration. Critical care was time spent personally by me on the following activities: development of treatment plan with patient and/or surrogate as well as nursing, discussions with consultants, evaluation of patient's response to treatment, examination of patient, obtaining history from patient or surrogate, ordering and performing treatments and interventions, ordering and review of laboratory studies, ordering and review of radiographic studies, pulse oximetry and re-evaluation of patient's condition.   Medications Ordered in ED Medications - No data to display   Initial Impression / Assessment and Plan / ED Course  I have reviewed the triage vital signs and the nursing notes.  Pertinent labs & imaging results that were available during my care of the patient were reviewed by me and considered in my medical decision making (see chart for details).        3:17 AM Patient presents from outside hospital for transfer for possible STEMI.  On my evaluation, patient is pain-free and appears well.  He has already been given multiple medications including heparin, aspirin and metoprolol.  He is chest pain-free at this time.  EKG shows some ST depression, but no acute STEMI.  Discussed the case with on-call cardiology fellow, as well as on-call cardiology attending Dr. Burt Knack.  We will not activate the Cath Lab at this time and he will be admitted to cardiology.  Final Clinical Impressions(s) / ED Diagnoses   Final diagnoses:  Unstable angina [facility])    ED Discharge Orders    None       Ripley Fraise, MD 06/26/18 [phone]

## 2018-06-26 NOTE — Progress Notes (Signed)
CRITICAL VALUE ALERT  Critical Value:  Trop 0.21   Date & Time Notied:  06/26/2018 0430  Provider Notified: Cards fellow  Orders Received/Actions taken:

## 2018-06-26 NOTE — ED Notes (Signed)
Patient's family (son and wife) wanted to leave contact information so that they can be called with updates about the patient [phone]

## 2018-06-26 NOTE — H&P (Signed)
Cardiology Consultation Note    Patient ID: John Palmer, MRN: [id number], DOB/AGE: [birth year] 85 y.o. Admit date: 06/26/2018   Date of Consult: 06/26/2018 Primary Physician: Monico Blitz, MD  Reason for Consultation: CP, ECG changes Requesting MD: Dr. Christy Gentles  HPI: John Palmer is a 85 y.o. male with a history of HLD, GERD who presents with chest pain.  Pt reported the onset of left sided chest pain radiating to the left arm early in the afternoon yesterday, which became progressively worse over the course of the day.  Given ongoing symptoms, the pt presented to the [facility] ED for evaluation.  There, he was hemodynamically stable.  His ECG showed RBBB with STD in inferior leads and V3-V6 and mild STE in aVR.  He was given full dose ASA, 5000 units IV heparin and 5 mg IV metoprolol.  Given acute ischemic changes on ECG and ongoing CP, pt was transferred to [facility] ED for consideration of emergent coronary angiography.  Upon arrival at the [facility] ED, pt was CP free.  ECG was unchanged from [facility].  Past Medical History:  Diagnosis Date  . GERD (gastroesophageal reflux disease)   . Hypercholesterolemia       Home Meds: Prior to Admission medications   Not on File    Allergies:  Allergies  Allergen Reactions  . Banana   . Levofloxacin     Social History   Socioeconomic History  . Marital status: Married    Spouse name: Not on file  . Number of children: Not on file  . Years of education: Not on file  . Highest education level: Not on file  Occupational History  . Not on file  Social Needs  . Financial resource strain: Not on file  . Food insecurity:    Worry: Not on file    Inability: Not on file  . Transportation needs:    Medical: Not on file    Non-medical: Not on file  Tobacco Use  . Smoking status: Never Smoker  . Smokeless tobacco: Never Used  Substance and Sexual Activity  . Alcohol use: Yes  . Drug use: Never  . Sexual activity: Not on  file  Lifestyle  . Physical activity:    Days per week: Not on file    Minutes per session: Not on file  . Stress: Not on file  Relationships  . Social connections:    Talks on phone: Not on file    Gets together: Not on file    Attends religious service: Not on file    Active member of club or organization: Not on file    Attends meetings of clubs or organizations: Not on file    Relationship status: Not on file  . Intimate partner violence:    Fear of current or ex partner: Not on file    Emotionally abused: Not on file    Physically abused: Not on file    Forced sexual activity: Not on file  Other Topics Concern  . Not on file  Social History Narrative  . Not on file     No family history on file.   Review of Systems: All other systems reviewed and are otherwise negative except as noted above.  Labs: No results for input(s): CKTOTAL, CKMB, TROPONINI in the last 72 hours. Lab Results  Component Value Date   WBC 10.5 06/26/2018   HGB 14.4 06/26/2018   HCT 44.3 06/26/2018   MCV 90.6 06/26/2018  PLT 272 06/26/2018   No results for input(s): NA, K, CL, CO2, BUN, CREATININE, CALCIUM, PROT, BILITOT, ALKPHOS, ALT, AST, GLUCOSE in the last 168 hours.  Invalid input(s): LABALBU No results found for: CHOL, HDL, LDLCALC, TRIG No results found for: DDIMER  Radiology/Studies:  Dg Chest Port 1 View  Result Date: 06/26/2018 CLINICAL DATA:  Chest pain EXAM: PORTABLE CHEST 1 VIEW COMPARISON:  06/26/2018 FINDINGS: Cardiac shadow is mildly prominent but accentuated by the portable technique. Aortic calcifications are noted. Lungs are well aerated bilaterally. No focal infiltrate or sizable effusion is seen. No acute abnormality is noted. IMPRESSION: No acute abnormality noted. Electronically Signed   By: Inez Catalina M.D.   On: 06/26/2018 03:06    Wt Readings from Last 3 Encounters:  06/26/18 99.8 kg    EKG: NSR, RBBB, STD in inferior leads, V3-V6, mild STE in aVR  Physical  Exam: Height 5\' 8"  (1.727 m), weight 99.8 kg. Body mass index is 33.45 kg/m. General: Well developed, well nourished, in no acute distress. Head: Normocephalic, atraumatic, sclera non-icteric, no xanthomas, nares are without discharge.  Neck: Negative for carotid bruits. JVD not elevated. Lungs: Clear bilaterally to auscultation without wheezes, rales, or rhonchi. Breathing is unlabored. Heart: RRR with S1 S2. No murmurs, rubs, or gallops appreciated. Abdomen: Soft, non-tender, non-distended with normoactive bowel sounds. No hepatomegaly. No rebound/guarding. No obvious abdominal masses. Msk:  Strength and tone appear normal for age. Extremities: No clubbing or cyanosis. No edema.  Distal pedal pulses are 2+ and equal bilaterally. Neuro: Alert and oriented X 3. No facial asymmetry. No focal deficit. Moves all extremities spontaneously. Psych:  Responds to questions appropriately with a normal affect.     Assessment and Plan  89M with HLD and GERD who presents with CP and ECG changes concerning for ACS.  1.  CP: Fortunately, pt was CP free upon arrival in [facility] ED.  As such, will plan for cardiac catheterization in AM, as he has no indications for emergent cath lab activation at present.  Continue IV heparin.  Echocardiogram ordered.  2.  HLD: Check lipid panel and start high dose atorvastatin.  3.  Labs: Given urgent transfer, still awaiting the remainder of laboratory data.  Signed, Doylene Canning, MD 06/26/2018, 3:11 AM

## 2018-06-26 NOTE — ED Triage Notes (Signed)
Patient arrived with EMS from [place] , left chest pain onset yesterday afternoon radiating to left arm transferred here for admission , he received ASA , Metropolol 5 mg IV and heparin 5000 units IV prior to arrival . Denies chest pain at arrival / respirations unlabore d.

## 2018-06-26 NOTE — Progress Notes (Signed)
Sheath removed, manual pressure held for 20 minutes. Site level 0 gauze and Tegaderm dressing clean, dry, intact. Bedrest to begin at 16:25 for 4 hours

## 2018-06-26 NOTE — Progress Notes (Signed)
[place] for heparin Indication: NSTEMI  Allergies  Allergen Reactions  . Levofloxacin     Patient Measurements: Height: 5\' 8"  (172.7 cm) Weight: 224 lb (101.6 kg) IBW/kg (Calculated) : 68.4 Heparin Dosing Weight: 90.9 kg  Vital Signs: Temp: 97.7 F (36.5 C) (05/29 0415) Temp Source: Oral (05/29 0415) BP: 125/78 (05/29 1117) Pulse Rate: 67 (05/29 1117)  Labs: Recent Labs    06/26/18 0252 06/26/18 0704 06/26/18 1106  HGB 14.4  --   --   HCT 44.3  --   --   PLT 272  --   --   APTT 72*  --   --   LABPROT 14.6  --   --   INR 1.2  --   --   HEPARINUNFRC  --   --  0.17*  CREATININE 1.63*  --   --   TROPONINI 0.21* 3.00*  --     Estimated Creatinine Clearance: 34.8 mL/min (A) (by C-G formula based on SCr of 1.63 mg/dL (H)).   Assessment: 85 yo man transferred from [facility] to continue heparin drip for NSTEMI. He was not on anticoagulation PTA.  Heparin level is subtherapeutic at 0.17 on 1100 units/hr. No bleeding noted and no problems with infusion per RN, CBC is normal. Possible cath today.  Goal of Therapy:  Heparin level 0.3-0.7 units/ml Monitor platelets by anticoagulation protocol: Yes   Plan:  Heparin 1350 units IV bolus then increase infusion to 1400 units/hr Check heparin level in 8 hours Daily heparin level and CBC while on heparin Monitor for bleeding complications   Thank you for involving pharmacy in this patient's care.  Renold Genta, PharmD, BCPS Clinical Pharmacist Clinical phone for 06/26/2018 until 3p is [phone] 06/26/2018 12:32 PM  **Pharmacist phone directory can be found on [email or web address] listed under [place]**

## 2018-06-26 NOTE — CV Procedure (Signed)
   Aborted right radial approach due to brachial artery 360 degree loop and adverse angulation between the innominate artery and the aortic arch directing the catheter down the descending aorta.  Significant resistance with advancing the catheter to the aortic valve.  Right femoral approach using real-time vascular ultrasound for guidance.  Severe multivessel coronary disease with 99% ostial calcified RCA proximal to a shepherd's crook TIMI grade II flow was noted.  Retrograde flow from left to right is also noted.  50% left main  Total occlusion of the LAD just beyond the dominant diagonal in the mid vessel.  The proximal to mid LAD contains 80 to 90% stenosis.  The diagonal contains 80% proximal narrowing.  Circumflex coronary artery gives 2 obtuse marginals that are free of significant disease but a relatively small in distribution.  LV EDP 26 mmHg.  Left main, LAD, and RCA are heavily calcified.  Discussed with Dr. Martinique.

## 2018-06-26 NOTE — Progress Notes (Signed)
TCTS consulted for CABG evaluation. °

## 2018-06-26 NOTE — Progress Notes (Signed)
  Echocardiogram 2D Echocardiogram has been performed.  Jannett Celestine 06/26/2018, 10:33 AM

## 2018-06-26 NOTE — ED Notes (Signed)
Patient's family updated on plan of care and admission by RN.

## 2018-06-26 NOTE — Progress Notes (Addendum)
Progress Note  Patient Name: John Palmer Date of Encounter: 06/26/2018  Primary Cardiologist: No primary care provider on file. new  Subjective   Feels well this am. No chest pain.  Inpatient Medications    Scheduled Meds: . [START ON 06/27/2018] aspirin EC  81 mg Oral Daily  . atorvastatin  80 mg Oral q1800  . metoprolol tartrate  12.5 mg Oral BID   Continuous Infusions: . heparin 1,100 Units/hr (06/26/18 0515)   PRN Meds: acetaminophen, nitroGLYCERIN, ondansetron (ZOFRAN) IV   Vital Signs    Vitals:   06/26/18 0315 06/26/18 0330 06/26/18 0348 06/26/18 0415  BP: 121/70 (!) 120/59 113/70 131/76  Pulse: 67  68 73  Resp: 13  16 15   Temp:   (!) 97.5 F (36.4 C) 97.7 F (36.5 C)  TempSrc:   Temporal Oral  SpO2: 95%  100% 95%  Weight:    102.4 kg  Height:    5\' 8"  (1.727 m)    Intake/Output Summary (Last 24 hours) at 06/26/2018 0859 Last data filed at 06/26/2018 0515 Gross per 24 hour  Intake 3.87 ml  Output -  Net 3.87 ml   Last 3 Weights 06/26/2018 06/26/2018  Weight (lbs) 225 lb 12.8 oz 220 lb  Weight (kg) 102.422 kg 99.791 kg      Telemetry    NSR with rare PVC- Personally Reviewed  ECG    NSR with RBBB, LPFB. Since yesterday inferolateral ST depression resolved.  - Personally Reviewed  Physical Exam   GEN: No acute distress.  Elderly WM appears younger than stated age.  Neck: No JVD, he is hard of hearing Cardiac: RRR, no murmurs, rubs, or gallops. Pulses good throughout.  Respiratory: Clear to auscultation bilaterally. GI: Soft, nontender, non-distended  MS: No edema; No deformity. Neuro:  Nonfocal  Psych: Normal affect   Labs    Chemistry Recent Labs  Lab 06/26/18 0252  NA 137  K 4.6  CL 104  CO2 21*  GLUCOSE 131*  BUN 16  CREATININE 1.63*  CALCIUM 9.1  GFRNONAA 37*  GFRAA 42*  ANIONGAP 12     Hematology Recent Labs  Lab 06/26/18 0252  WBC 10.5  RBC 4.89  HGB 14.4  HCT 44.3  MCV 90.6  MCH 29.4  MCHC 32.5  RDW  13.5  PLT 272    Cardiac Enzymes Recent Labs  Lab 06/26/18 0252  TROPONINI 0.21*   No results for input(s): TROPIPOC in the last 168 hours.   BNPNo results for input(s): BNP, PROBNP in the last 168 hours.   DDimer No results for input(s): DDIMER in the last 168 hours.   Radiology    Dg Chest Port 1 View  Result Date: 06/26/2018 CLINICAL DATA:  Chest pain EXAM: PORTABLE CHEST 1 VIEW COMPARISON:  06/26/2018 FINDINGS: Cardiac shadow is mildly prominent but accentuated by the portable technique. Aortic calcifications are noted. Lungs are well aerated bilaterally. No focal infiltrate or sizable effusion is seen. No acute abnormality is noted. IMPRESSION: No acute abnormality noted. Electronically Signed   By: Inez Catalina M.D.   On: 06/26/2018 03:06    Cardiac Studies   none  Patient Profile     85 y.o. male from [place] with history of HLD presents with NSTEMI  Assessment & Plan    1. NSTEMI. Troponin 0.21. Ecg with dynamic inferolateral ST depression. Currently pain free on IV heparin. No prior cardiac history. States he had severe chest and left arm pain for 2-3 hours. Will  continue ASA. Add beta blocker. I discussed with him at length concerning conservative medical approach versus invasive approach with cardiac cath and possible PCI. He considers himself to be in excellent health and is very active. He would prefer an invasive approach. We discussed concern about his kidney function. Will hydrate for 3 hours prior to cath. The procedure and risks were reviewed including but not limited to death, myocardial infarction, stroke, arrythmias, bleeding, transfusion, emergency surgery, dye allergy, or renal dysfunction. The patient voices understanding and is agreeable to proceed. 2. HLD - reports being on cholesterol therapy at home. Will check lipid panel. On high dose lipitor now. 3. CKD stage 3. Patient states he was never told he had kidney problems. Sees Dr Manuella Ghazi in [place]. Will  request old labs to review. 4. RBBB,LPFB  For questions or updates, please contact [place] Please consult [email or web address] for contact info under    Labs reviewed from primary care. Creatinine was 1.71 in November. Records placed in paper chart     Signed,  [place], MD  06/26/2018, 8:59 AM

## 2018-06-26 NOTE — Progress Notes (Signed)
[place] for heparin Indication: NSTEMI  Allergies  Allergen Reactions  . Levofloxacin     Patient Measurements: Height: 5\' 8"  (172.7 cm) Weight: 224 lb (101.6 kg) IBW/kg (Calculated) : 68.4 Heparin Dosing Weight: 90.9 kg  Vital Signs: BP: 131/66 (05/29 1637) Pulse Rate: 70 (05/29 1637)  Labs: Recent Labs    06/26/18 0252 06/26/18 0704 06/26/18 1106  HGB 14.4  --   --   HCT 44.3  --   --   PLT 272  --   --   APTT 72*  --   --   LABPROT 14.6  --   --   INR 1.2  --   --   HEPARINUNFRC  --   --  0.17*  CREATININE 1.63*  --   --   TROPONINI 0.21* 3.00*  --     Estimated Creatinine Clearance: 34.8 mL/min (A) (by C-G formula based on SCr of 1.63 mg/dL (H)).   Assessment: 85 yo man transferred from [facility] to continue heparin drip for NSTEMI. He was not on anticoagulation PTA.  Patient s/p Cath with sheath removed at ~1630. Order received to restart heparin 8 hours after sheath removal. Will restart at previously ordered rate.    Goal of Therapy:  Heparin level 0.3-0.7 units/ml Monitor platelets by anticoagulation protocol: Yes   Plan:  Restart Heparin at 1400 units/hr at 0030 (8 hours from sheath removal) Check heparin level in 8 hours Daily heparin level and CBC while on heparin Monitor for bleeding complications   Arnice Vanepps A. Levada Dy, PharmD, [place] Please utilize Amion for appropriate phone number to reach the unit pharmacist ([place])

## 2018-06-26 NOTE — Consult Note (Signed)
[address]       [place] [address]             [phone]      Cardiothoracic Surgery Consultation   Reason for Consult: Severe multivessel coronary artery disease Referring Physician: Dr. Peter Martinique  John Palmer is an 85 y.o. male.  HPI:   The patient is a fairly healthy, independent, and active 85 year old gentleman with a history of hyperlipidemia and remote smoking who reports having approximately a 6-week history of exertional substernal chest discomfort radiating to his left arm relieved with rest.  Yesterday morning he had a severe episode which progressively became worse over the course of the day and he presented to [facility] emergency department for evaluation.  Electrocardiogram showed ST depression in the inferior leads and V3 through V6 with mild ST elevation in aVR.  He had ongoing chest pain and was transferred to [facility] for further evaluation.  He was chest pain-free on arrival.  Electrocardiogram is unchanged.  His initial troponin here was 0.21 at 3 AM this morning and then at 7 AM was 3.0.  He was taken to catheterization lab this afternoon which showed severe diffuse calcific coronary disease with a subtotally occluded ostial to proximal RCA filling antegrade and retrograde by collaterals from left.  There is about 50% ostial left main stenosis.  The proximal to mid LAD had 90% stenosis.  There is a diagonal branch with 80% stenosis.  The LAD beyond the diagonal was totally occluded and filled by left to left collaterals.  LVEDP was elevated at 25.  An echocardiogram showed a left ventricular ejection fraction of 60 to 65%.  There is mild to moderate MR.  Past Medical History:  Diagnosis Date   GERD (gastroesophageal reflux disease)    Hypercholesterolemia       Family History: Negative for heart disease and stroke   Social History: The patient lives at home with his wife who was in her late 25s.  He has 2 sons.  The  patient is still active and independent and does yard work at home.  He has a remote smoking history over 60 years ago.  Allergies:  Allergies  Allergen Reactions   Levofloxacin     Medications:  I have reviewed the patient's current medications. Prior to Admission:  Medications Prior to Admission  Medication Sig Dispense Refill Last Dose   aspirin EC 81 MG tablet Take 81 mg by mouth daily.   06/25/2018 at Unknown time   Cholecalciferol (VITAMIN D3) 125 MCG (5000 UT) CAPS Take 5,000 Units by mouth daily.   06/25/2018 at Unknown time   co-enzyme Q-10 30 MG capsule Take 30 mg by mouth daily.   06/25/2018 at Unknown time   Multiple Vitamin (MULTIVITAMIN) capsule Take 1 capsule by mouth daily.   06/25/2018 at Unknown time   Omega-3 1000 MG CAPS Take 2 g by mouth daily.   06/25/2018 at Unknown time   rOPINIRole (REQUIP) 2 MG tablet Take 2-4 mg by mouth See admin instructions. Takes 2 mg in the morning and 4 mg in the evening   06/25/2018 at Unknown time   saw palmetto 160 MG capsule Take 160 mg by mouth daily.   06/25/2018 at Unknown time   simvastatin (ZOCOR) 40 MG tablet Take 40 mg by mouth at bedtime.   06/24/2018   traZODone (DESYREL) 100 MG tablet Take 100 mg by mouth at bedtime.   06/24/2018  vitamin B-12 (CYANOCOBALAMIN) 100 MCG tablet Take 100 mcg by mouth daily.   06/25/2018 at Unknown time   Scheduled:  aspirin  81 mg Oral Daily   atorvastatin  80 mg Oral q1800   metoprolol tartrate  12.5 mg Oral BID   sodium chloride flush  3 mL Intravenous Q12H   sodium chloride flush  3 mL Intravenous Q12H   Continuous:  sodium chloride 75 mL/hr at 06/26/18 1726   sodium chloride     IRW:ERXVQM chloride, acetaminophen, hydrALAZINE, labetalol, nitroGLYCERIN, ondansetron (ZOFRAN) IV, oxyCODONE, sodium chloride flush Anti-infectives (From admission, onward)   None      Results for orders placed or performed during the hospital encounter of 06/26/18 (from the past 48 hour(s))    Basic metabolic panel     Status: Abnormal   Collection Time: 06/26/18  2:52 AM  Result Value Ref Range   Sodium 137 135 - 145 mmol/L   Potassium 4.6 3.5 - 5.1 mmol/L   Chloride 104 98 - 111 mmol/L   CO2 21 (L) 22 - 32 mmol/L   Glucose, Bld 131 (H) 70 - 99 mg/dL   BUN 16 8 - 23 mg/dL   Creatinine, Ser 1.63 (H) 0.61 - 1.24 mg/dL   Calcium 9.1 8.9 - 10.3 mg/dL   GFR calc non Af Amer 37 (L) >60 mL/min   GFR calc Af Amer 42 (L) >60 mL/min   Anion gap 12 5 - 15    Comment: Performed at [facility], [address], [place], [place] [address]  CBC with Differential/Platelet     Status: None   Collection Time: 06/26/18  2:52 AM  Result Value Ref Range   WBC 10.5 4.0 - 10.5 K/uL   RBC 4.89 4.22 - 5.81 MIL/uL   Hemoglobin 14.4 13.0 - 17.0 g/dL   HCT 44.3 39.0 - 52.0 %   MCV 90.6 80.0 - 100.0 fL   MCH 29.4 26.0 - 34.0 pg   MCHC 32.5 30.0 - 36.0 g/dL   RDW 13.5 11.5 - 15.5 %   Platelets 272 150 - 400 K/uL   nRBC 0.0 0.0 - 0.2 %   Neutrophils Relative % 62 %   Neutro Abs 6.6 1.7 - 7.7 K/uL   Lymphocytes Relative 23 %   Lymphs Abs 2.5 0.7 - 4.0 K/uL   Monocytes Relative 8 %   Monocytes Absolute 0.8 0.1 - 1.0 K/uL   Eosinophils Relative 5 %   Eosinophils Absolute 0.5 0.0 - 0.5 K/uL   Basophils Relative 1 %   Basophils Absolute 0.1 0.0 - 0.1 K/uL   Immature Granulocytes 1 %   Abs Immature Granulocytes 0.05 0.00 - 0.07 K/uL    Comment: Performed at [facility], [address], [place], [place] [address]  Troponin I - ONCE - STAT     Status: Abnormal   Collection Time: 06/26/18  2:52 AM  Result Value Ref Range   Troponin I 0.21 (HH) <0.03 ng/mL    Comment: CRITICAL RESULT CALLED TO, READ BACK BY AND VERIFIED WITH: DUVALL,D RN 06/26/2018 0422 JORDANS Performed at [facility], [place] [address] [place],  [address]   APTT     Status: Abnormal   Collection Time: 06/26/18  2:52 AM  Result Value Ref Range   aPTT 72 (H) 24 - 36 seconds    Comment:        IF  BASELINE aPTT IS ELEVATED, SUGGEST PATIENT RISK ASSESSMENT BE USED TO DETERMINE  APPROPRIATE ANTICOAGULANT THERAPY. Performed at [facility], [place] [address] [place], [place] [address]   Protime-INR     Status: None   Collection Time: 06/26/18  2:52 AM  Result Value Ref Range   Prothrombin Time 14.6 11.4 - 15.2 seconds   INR 1.2 0.8 - 1.2    Comment: (NOTE) INR goal varies based on device and disease states. Performed at [facility], [place] [address], [place], [place] [address]   SARS Coronavirus 2 (CEPHEID - Performed in [place] hospital lab), Hosp Order     Status: None   Collection Time: 06/26/18  2:58 AM  Result Value Ref Range   SARS Coronavirus 2 NEGATIVE NEGATIVE    Comment: (NOTE) If result is NEGATIVE SARS-CoV-2 target nucleic acids are NOT DETECTED. The SARS-CoV-2 RNA is generally detectable in upper and lower  respiratory specimens during the acute phase of infection. The lowest  concentration of SARS-CoV-2 viral copies this assay can detect is 250  copies / mL. A negative result does not preclude SARS-CoV-2 infection  and should not be used as the sole basis for treatment or other  patient management decisions.  A negative result may occur with  improper specimen collection / handling, submission of specimen other  than nasopharyngeal swab, presence of viral mutation(s) within the  areas targeted by this assay, and inadequate number of viral copies  (<250 copies / mL). A negative result must be combined with clinical  observations, patient history, and epidemiological information. If result is POSITIVE SARS-CoV-2 target nucleic acids are DETECTED. The SARS-CoV-2 RNA is generally detectable in upper and lower  respiratory specimens dur ing the acute phase of infection.  Positive  results are indicative of active infection with SARS-CoV-2.  Clinical  correlation with patient history and other diagnostic information is  necessary to determine patient  infection status.  Positive results do  not rule out bacterial infection or co-infection with other viruses. If result is PRESUMPTIVE POSTIVE SARS-CoV-2 nucleic acids MAY BE PRESENT.   A presumptive positive result was obtained on the submitted specimen  and confirmed on repeat testing.  While 2019 novel coronavirus  (SARS-CoV-2) nucleic acids may be present in the submitted sample  additional confirmatory testing may be necessary for epidemiological  and / or clinical management purposes  to differentiate between  SARS-CoV-2 and other Sarbecovirus currently known to infect humans.  If clinically indicated additional testing with an alternate test  methodology [phone]) is advised. The SARS-CoV-2 RNA is generally  detectable in upper and lower respiratory sp ecimens during the acute  phase of infection. The expected result is Negative. Fact Sheet for Patients:  StrictlyIdeas.no Fact Sheet for Healthcare Providers: BankingDealers.co.za This test is not yet approved or cleared by the [place] FDA and has been authorized for detection and/or diagnosis of SARS-CoV-2 by FDA under an Emergency Use Authorization (EUA).  This EUA will remain in effect (meaning this test can be used) for the duration of the COVID-19 declaration under Section 564(b)(1) of the Act, 21 U.S.C. section 360bbb-3(b)(1), unless the authorization is terminated or revoked sooner. Performed at [facility], [place] [address] [place], [place] [address]   Troponin I - Once     Status: Abnormal   Collection Time: 06/26/18  7:04 AM  Result Value Ref Range   Troponin I 3.00 (HH) <0.03 ng/mL    Comment: CRITICAL RESULT CALLED TO, READ BACK BY AND VERIFIED WITH: [email or web address],[id number] 06/26/2018 CLARK,S Performed at [facility], [place] [address], [place], [place] [address]   Heparin level (unfractionated)     Status: Abnormal   Collection Time: 06/26/18 11:06 AM    Result Value Ref Range   Heparin Unfractionated 0.17 (L) 0.30 - 0.70 IU/mL    Comment: (NOTE) If heparin results are below expected values, and patient dosage has  been confirmed, suggest follow up testing of antithrombin III levels. Performed at [facility], [place] [address], [place], [place] [address]   Lipid panel     Status: None   Collection Time: 06/26/18 11:06 AM  Result Value Ref Range   Cholesterol 164 0 - 200 mg/dL   Triglycerides 118 <150 mg/dL   HDL 51 >40 mg/dL   Total CHOL/HDL Ratio 3.2 RATIO   VLDL 24 0 - 40 mg/dL   LDL Cholesterol 89 0 - 99 mg/dL    Comment:        Total Cholesterol/HDL:CHD Risk Coronary Heart Disease Risk Table                     Men   Women  1/2 Average Risk   3.4   3.3  Average Risk       5.0   4.4  2 X Average Risk   9.6   7.1  3 X Average Risk  23.4   11.0        Use the calculated Patient Ratio above and the CHD Risk Table to determine the patient's CHD Risk.        ATP III CLASSIFICATION (LDL):  <100     mg/dL   Optimal  100-129  mg/dL   Near or Above                    Optimal  130-159  mg/dL   Borderline  160-189  mg/dL   High  >190     mg/dL   Very High Performed at [place] [address], [place], [place] [address]   POCT Activated clotting time     Status: None   Collection Time: 06/26/18  3:43 PM  Result Value Ref Range   Activated Clotting Time 131 seconds    Dg Chest Port 1 View  Result Date: 06/26/2018 CLINICAL DATA:  Chest pain EXAM: PORTABLE CHEST 1 VIEW COMPARISON:  06/26/2018 FINDINGS: Cardiac shadow is mildly prominent but accentuated by the portable technique. Aortic calcifications are noted. Lungs are well aerated bilaterally. No focal infiltrate or sizable effusion is seen. No acute abnormality is noted. IMPRESSION: No acute abnormality noted. Electronically Signed   By: Inez Catalina M.D.   On: 06/26/2018 03:06    Review of Systems  Constitutional: Positive for malaise/fatigue.  HENT:  Positive for hearing loss.   Eyes: Negative.   Respiratory: Negative for shortness of breath.   Cardiovascular: Positive for chest pain. Negative for palpitations, orthopnea and leg swelling.  Gastrointestinal: Positive for heartburn.  Genitourinary: Negative.   Musculoskeletal: Negative.   Skin: Negative.   Neurological: Negative.   Endo/Heme/Allergies: Negative.   Psychiatric/Behavioral: Negative.    Blood pressure 131/66, pulse 70, temperature 97.7 F (36.5 C), temperature source Oral, resp. rate 13, height 5\' 8"  (1.727 m), weight 101.6 kg, SpO2 (!) 89 %. Physical Exam  Constitutional: He is oriented to person, place, and time.  Elderly gentleman who appears younger than his stated age and in no distress  HENT:  Head: Normocephalic and atraumatic.  Mouth/Throat: Oropharynx is  clear and moist.  Teeth in fair condition for his age  Eyes: Pupils are equal, round, and reactive to light. EOM are normal.  Neck: Normal range of motion. Neck supple. No JVD present.  Cardiovascular: Normal rate, regular rhythm, normal heart sounds and intact distal pulses.  No murmur heard. Respiratory: Effort normal and breath sounds normal.  GI: Soft. Bowel sounds are normal. There is no abdominal tenderness.  Musculoskeletal: Normal range of motion.        General: No edema.  Lymphadenopathy:    He has no cervical adenopathy.  Neurological: He is alert and oriented to person, place, and time. He has normal strength.  Skin: Skin is warm and dry.  Psychiatric: He has a normal mood and affect. His behavior is normal. Judgment and thought content normal.   ECHOCARDIOGRAM REPORT       Patient Name:   John Palmer Date of Exam: 06/26/2018 Medical Rec #:  [id number]       Height:       68.0 in Accession #:    [id number]      Weight:       225.8 lb Date of Birth:  [birth year]      BSA:          2.15 m Patient Age:    51 years        BP:           131/76 mmHg Patient Gender: M               HR:            78 bpm. Exam Location:  Inpatient    Procedure: 2D Echo  Indications:    Acute Myocardial Infarction 410   History:        Patient has no prior history of Echocardiogram examinations.                 Risk Factors: Dyslipidemia.   Sonographer:    Jannett Celestine RDCS (AE) Referring Phys: [phone] [facility]    Sonographer Comments: Suboptimal parasternal window. IMPRESSIONS    1. The left ventricle has normal systolic function with an ejection fraction of 60-65%. The cavity size was normal. Left ventricular diastolic Doppler parameters are consistent with pseudonormalization.  2. The right ventricle has low normal systolic function. The cavity was mildly enlarged. There is no increase in right ventricular wall thickness.  3. Left atrial size was mildly dilated.  4. Right atrial size was mildly dilated.  5. Mitral valve regurgitation is mild to moderate by color flow Doppler.  6. Moderate thickening of the aortic valve. Moderate calcification of the aortic valve. Aortic valve regurgitation is mild by color flow Doppler. Moderate aortic annular calcification noted.  7. The tricuspid valve is not well visualized. Tricuspid valve regurgitation was not assessed by color flow Doppler.  8. The aortic root and ascending aorta are normal in size and structure.  9. The interatrial septum was not assessed.  FINDINGS  Left Ventricle: The left ventricle has normal systolic function, with an ejection fraction of 60-65%. The cavity size was normal. There is no increase in left ventricular wall thickness. Left ventricular diastolic Doppler parameters are consistent with  pseudonormalization.  Right Ventricle: The right ventricle has low normal systolic function. The cavity was mildly enlarged. There is no increase in right ventricular wall thickness.  Left Atrium: Left atrial size was mildly dilated.  Right Atrium: Right atrial size was mildly dilated. Right atrial pressure  is estimated at 10 mmHg.  Interatrial Septum: The interatrial septum was not assessed.  Pericardium: There is no evidence of pericardial effusion.  Mitral Valve: The mitral valve is normal in structure. Mitral valve regurgitation is mild to moderate by color flow Doppler.  Tricuspid Valve: The tricuspid valve is not well visualized. Tricuspid valve regurgitation was not assessed by color flow Doppler.  Aortic Valve: The aortic valve is normal in structure. Moderate thickening of the aortic valve. Moderate calcification of the aortic valve. Aortic valve regurgitation is mild by color flow Doppler. Moderate aortic annular calcification noted.  Pulmonic Valve: The pulmonic valve was not well visualized. Pulmonic valve regurgitation was not assessed by color flow Doppler.  Aorta: The aortic root and ascending aorta are normal in size and structure.    +--------------+--------++  LEFT VENTRICLE            +----------------+---------++ +--------------+--------++  Diastology                    PLAX 2D                   +----------------+---------++ +--------------+--------++  LV e' lateral:   6.20 cm/s    LVIDd:         5.08 cm    +----------------+---------++ +--------------+--------++  LV E/e' lateral: 16.3         LVIDs:         3.48 cm    +----------------+---------++ +--------------+--------++  LV e' medial:    5.66 cm/s    LV PW:         1.44 cm    +----------------+---------++ +--------------+--------++  LV E/e' medial:  17.8         LV IVS:        1.33 cm    +----------------+---------++ +--------------+--------++  LVOT diam:     1.90 cm    +--------------+--------++  LV SV:         73 ml      +--------------+--------++  LV SV Index:   32.16      +--------------+--------++  LVOT Area:     2.84 cm   +--------------+--------++                            +--------------+--------++  +---------------+---------++  RIGHT VENTRICLE              +---------------+---------++  RV Basal diam:  4.29 cm     +---------------+---------++  RV Mid diam:    4.22 cm     +---------------+---------++  RV S prime:     9.03 cm/s   +---------------+---------++  TAPSE (M-mode): 2.0 cm      +---------------+---------++  +---------------+-------++-----------++  LEFT ATRIUM              Index         +---------------+-------++-----------++  LA diam:        4.30 cm  2.00 cm/m    +---------------+-------++-----------++  LA Vol (A2C):   42.9 ml  19.94 ml/m   +---------------+-------++-----------++  LA Vol (A4C):   47.7 ml  22.17 ml/m   +---------------+-------++-----------++  LA Biplane Vol: 45.7 ml  21.24 ml/m   +---------------+-------++-----------++ +------------+---------++-----------++  RIGHT ATRIUM            Index         +------------+---------++-----------++  RA Area:     21.60 cm                +------------+---------++-----------++  RA Volume:   54.70 ml   25.42 ml/m   +------------+---------++-----------++  +------------+-----------++  AORTIC VALVE               +------------+-----------++  LVOT Vmax:   69.50 cm/s    +------------+-----------++  LVOT Vmean:  44.100 cm/s   +------------+-----------++  LVOT VTI:    0.139 m       +------------+-----------++   +-------------+-------++  AORTA                   +-------------+-------++  Ao Root diam: 3.50 cm   +-------------+-------++  +--------------+----------++  MITRAL VALVE                 +--------------+-------+ +--------------+----------++   SHUNTS                   MV Area (PHT): 3.53 cm      +--------------+-------+ +--------------+----------++   Systemic VTI:  0.14 m    MV PHT:        62.35 msec    +--------------+-------+ +--------------+----------++   Systemic Diam: 1.90 cm   MV Decel Time: 215 msec      +--------------+-------+ +--------------+----------++ +-------------+-----------++  MR Peak grad: 89.9 mmHg      +-------------+-----------++  MR Mean grad: 55.0 mmHg     +-------------+-----------++  MR Vmax:      474.00 cm/s   +-------------+-----------++  MR Vmean:     347.0 cm/s    +-------------+-----------++ +--------------+-----------++  MV E velocity: 101.00 cm/s   +--------------+-----------++    Mertie Moores MD Electronically signed by Mertie Moores MD Signature Date/Time: 06/26/2018/2:25:45 PM    Physicians   Panel Physicians Referring Physician Case Authorizing Physician  Belva Crome, MD (Primary)    Procedures   LEFT HEART CATH AND CORONARY ANGIOGRAPHY  Conclusion    Severe, calcified, coronary artery disease with subtotally occluded ostial to proximal RCA within a "[place]".  RCA fills by left-to-right collaterals and also antegrade.  50% left main.  Proximal to mid 90% LAD.  The significant diagonal contains 80% stenosis.  The LAD beyond the diagonal is totally occluded and fills by left to left collaterals.  2 obtuse marginals arise from the circumflex and are the sources of collaterals to the right coronary and LAD.  Elevated LVEDP, 25 mmHg.  RECOMMENDATIONS:   No interventional options exist.  Discussed with treating team, Dr. Martinique.    Management options would include medical therapy versus high risk CABG.  Recommendations   Antiplatelet/Anticoag Discussed with Dr. Martinique.  No reasonable percutaneous options exist.  Will have surgical consultation to consider open heart surgery.  Age alone makes him high risk.  Surgeon Notes     06/26/2018 3:50 PM CV Procedure signed by Belva Crome, MD  Indications   Non-ST elevation (NSTEMI) myocardial infarction ([place]) [I21.4 (ICD-10-CM)]  Procedural Details   Technical Details After multiple sticks, the right radial was cannulated using real-time vascular ultrasound.  A 5 6 slender sheath was placed using the modified Seldinger needle.  Difficulty advancing the wire in the brachial was  identified to be related to a 360 degree loop.  This was negotiated with a versa core wire and straightened with the stiff wire and catheter.  We then advanced to the ascending aorta with difficulty due to angulation of the innominate artery and aortic arch.  The catheter could not be advanced freely into the a sending aorta.  With these difficulties, switch over to the right femoral approach  was performed.  The right femoral was sterilely prepped and draped. 1% Xylocaine local infiltration was then given for local analgesia. Sedation was administered in the form of intravenous Versed and fentanyl.. Using real-time vascular ultrasound, a Seldinger needle was used to perform an anterior wall common femoral artery stick. A VUS image was saved for the permanent record.  The iliac and aorta were noted to be tortuous.  A 5 French sheath was inserted using the modified Seldinger technique. Coronary angiography was then performed using JR4, JL4, and JL 5 diagnostic 5 French catheters. Left ventriculography was not performed.  Hemodynamics were measured with an angled pigtail.  Additional contrast was not given because of baseline stage III CKD.. .  During this procedure the patient is administered a total of Versed 0.5 mg and Fentanyl 25 mg to achieve and maintain moderate conscious sedation.  The patient's heart rate, blood pressure, and oxygen saturation are monitored continuously during the procedure. The period of conscious sedation is 51 minutes, of which I was present face-to-face 100% of this time. Estimated blood loss <50 mL.   During this procedure medications were administered to achieve and maintain moderate conscious sedation while the patient's heart rate, blood pressure, and oxygen saturation were continuously monitored and I was present face-to-face 100% of this time.  Medications  (Filter: Administrations occurring from 06/26/18 1430 to 06/26/18 1552)  Medication Rate/Dose/Volume Action  Date Time    midazolam (VERSED) injection (mg) 0.5 mg Given 06/26/18 1450   Total dose as of 06/26/18 1803        0.5 mg        fentaNYL (SUBLIMAZE) injection (mcg) 25 mcg Given 06/26/18 1451   Total dose as of 06/26/18 1803        25 mcg        Heparin (Porcine) in NaCl 1000-0.9 UT/500ML-% SOLN (mL) 500 mL Given 06/26/18 1451   Total dose as of 06/26/18 1803 500 mL Given 1451   1,000 mL        lidocaine (PF) (XYLOCAINE) 1 % injection (mL) 2 mL Given 06/26/18 1451   Total dose as of 06/26/18 1803 18 mL Given 1518   20 mL        Radial Cocktail/Verapamil only (mL)  Given 06/26/18 1503   Total dose as of 06/26/18 1803        Cannot be calculated        iohexol (OMNIPAQUE) 350 MG/ML injection (mL) 70 mL Given 06/26/18 1544   Total dose as of 06/26/18 1803        70 mL        atorvastatin (LIPITOR) tablet 80 mg (mg) *Not included in total MAR Hold 06/26/18 1430   Dosing weight:  99.8 kg        Total dose as of 06/26/18 1803        Cannot be calculated        metoprolol tartrate (LOPRESSOR) tablet 12.5 mg (mg) *Not included in total [place] [facility] Hold 06/26/18 1430   Dosing weight:  102.4 kg        Total dose as of 06/26/18 1803        Cannot be calculated        nitroGLYCERIN (NITROSTAT) SL tablet 0.4 mg (mg) *Not included in total MAR Hold 06/26/18 1430   Dosing weight:  99.8 kg        Total dose as of 06/26/18 1803        Cannot be calculated  sodium chloride flush (NS) 0.9 % injection 3 mL (mL) *Not included in total [facility] Hold 06/26/18 1430   Dosing weight:  102.4 kg        Total dose as of 06/26/18 1803        Cannot be calculated        acetaminophen (TYLENOL) tablet 650 mg (mg) *Not included in total MAR Hold 06/26/18 1430   Dosing weight:  99.8 kg        Total dose as of 06/26/18 1803        Cannot be calculated        aspirin EC tablet 81 mg (mg) *Not included in total MAR Hold 06/26/18 1430   Dosing weight:  102.4 kg        Total dose as of 06/26/18 1803        Cannot be calculated         ondansetron (ZOFRAN) injection 4 mg (mg) *Not included in total MAR Hold 06/26/18 1430   Dosing weight:  99.8 kg        Total dose as of 06/26/18 1803        Cannot be calculated        Sedation Time   Sedation Time Physician-1: 50 minutes 20 seconds  Coronary Findings   Diagnostic  Dominance: Right  Left Main  Ost LM to Mid LM lesion 50% stenosed  Ost LM to Mid LM lesion is 50% stenosed.  Left Anterior Descending  Collaterals  Mid LAD filled by collaterals from 1st Diag.    Ost LAD lesion 80% stenosed  Ost LAD lesion is 80% stenosed.  Prox LAD-1 lesion 90% stenosed  Prox LAD-1 lesion is 90% stenosed.  Prox LAD-2 lesion 100% stenosed  Prox LAD-2 lesion is 100% stenosed.  First Diagonal Branch  Ost 1st Diag lesion 85% stenosed  Ost 1st Diag lesion is 85% stenosed.  Second Diagonal Branch  Collaterals  2nd Diag filled by collaterals from 1st Mrg.    Left Circumflex  First Obtuse Marginal Branch  Ost 1st Mrg to 1st Mrg lesion 30% stenosed  Ost 1st Mrg to 1st Mrg lesion is 30% stenosed.  Right Coronary Artery  Ost RCA to Prox RCA lesion 99% stenosed  Ost RCA to Prox RCA lesion is 99% stenosed.  Prox RCA lesion 70% stenosed  Prox RCA lesion is 70% stenosed.  Mid RCA lesion 30% stenosed  Mid RCA lesion is 30% stenosed.  Right Posterior Atrioventricular Branch  Collaterals  Post Atrio filled by collaterals from Dist Cx.    Intervention   No interventions have been documented.  Left Heart   Left Ventricle LV end diastolic pressure is moderately elevated.  Coronary Diagrams   Diagnostic  Dominance: Right    Intervention   Implants    No implant documentation for this case.  Syngo Images   Show images for CARDIAC CATHETERIZATION  Images on Long Term Storage   Show images for John Palmer, John Palmer to Procedure Log   Procedure Log    Hemo Data    Most Recent Value  AO Systolic Pressure 106 mmHg  AO Diastolic Pressure 64 mmHg  AO Mean 83 mmHg   LV Systolic Pressure 269 mmHg  LV Diastolic Pressure 16 mmHg  LV EDP 26 mmHg  AOp Systolic Pressure 485 mmHg  AOp Diastolic Pressure 66 mmHg  AOp Mean Pressure 88 mmHg  LVp Systolic Pressure 462 mmHg  LVp Diastolic Pressure 16 mmHg  LVp EDP  Pressure 26 mmHg     Assessment/Plan:  This 85 year old active and independent gentleman who is in overall fairly good health has severe multivessel coronary disease presenting with a non-ST segment elevation MI.  His coronary stenoses are not amenable to PCI and therefore coronary bypass surgery is the only treatment option other than medical therapy and palliative care.  His coronary arteries appear suitable for coronary bypass surgery and his left ventricular function is normal.  He does have Stage III chronic kidney disease with a creatinine of 1.63 on admission.  Apparently it was 1.7 about 6 months ago.  I think his operative risk is increased due to his age and chronic kidney disease but the operative risk is still relatively low compared to his risk of no revascularization. I discussed the operative procedure with the patient and his son by telephone including alternatives, benefits and risks; including but not limited to bleeding, blood transfusion, infection, stroke, myocardial infarction, graft failure, heart block requiring a permanent pacemaker, organ dysfunction, and death.  John Palmer understands and agrees to proceed.  We will schedule surgery for Monday assuming that his kidney function remained stable.   I spent 40 minutes performing this consultation and > 50% of this time was spent face to face counseling and coordinating the care of this patient's severe multivessel coronary disease.   Gaye Pollack 06/26/2018, 5:53 PM

## 2018-06-27 DIAGNOSIS — E875 Hyperkalemia: Secondary | ICD-10-CM | POA: Diagnosis present

## 2018-06-27 DIAGNOSIS — J9811 Atelectasis: Secondary | ICD-10-CM | POA: Diagnosis not present

## 2018-06-27 DIAGNOSIS — J90 Pleural effusion, not elsewhere classified: Secondary | ICD-10-CM | POA: Diagnosis not present

## 2018-06-27 DIAGNOSIS — Z87891 Personal history of nicotine dependence: Secondary | ICD-10-CM | POA: Diagnosis not present

## 2018-06-27 DIAGNOSIS — I25118 Atherosclerotic heart disease of native coronary artery with other forms of angina pectoris: Secondary | ICD-10-CM

## 2018-06-27 DIAGNOSIS — K219 Gastro-esophageal reflux disease without esophagitis: Secondary | ICD-10-CM | POA: Diagnosis present

## 2018-06-27 DIAGNOSIS — I472 Ventricular tachycardia: Secondary | ICD-10-CM

## 2018-06-27 DIAGNOSIS — I2584 Coronary atherosclerosis due to calcified coronary lesion: Secondary | ICD-10-CM | POA: Diagnosis not present

## 2018-06-27 DIAGNOSIS — Z881 Allergy status to other antibiotic agents status: Secondary | ICD-10-CM | POA: Diagnosis not present

## 2018-06-27 DIAGNOSIS — I214 Non-ST elevation (NSTEMI) myocardial infarction: Secondary | ICD-10-CM | POA: Diagnosis not present

## 2018-06-27 DIAGNOSIS — I440 Atrioventricular block, first degree: Secondary | ICD-10-CM | POA: Diagnosis not present

## 2018-06-27 DIAGNOSIS — R7303 Prediabetes: Secondary | ICD-10-CM | POA: Diagnosis not present

## 2018-06-27 DIAGNOSIS — I200 Unstable angina: Secondary | ICD-10-CM | POA: Diagnosis not present

## 2018-06-27 DIAGNOSIS — E8779 Other fluid overload: Secondary | ICD-10-CM | POA: Diagnosis not present

## 2018-06-27 DIAGNOSIS — E7800 Pure hypercholesterolemia, unspecified: Secondary | ICD-10-CM | POA: Diagnosis present

## 2018-06-27 DIAGNOSIS — I951 Orthostatic hypotension: Secondary | ICD-10-CM | POA: Diagnosis not present

## 2018-06-27 DIAGNOSIS — N183 Chronic kidney disease, stage 3 (moderate): Secondary | ICD-10-CM | POA: Diagnosis not present

## 2018-06-27 DIAGNOSIS — H9190 Unspecified hearing loss, unspecified ear: Secondary | ICD-10-CM | POA: Diagnosis present

## 2018-06-27 DIAGNOSIS — I25110 Atherosclerotic heart disease of native coronary artery with unstable angina pectoris: Secondary | ICD-10-CM | POA: Diagnosis not present

## 2018-06-27 DIAGNOSIS — N179 Acute kidney failure, unspecified: Secondary | ICD-10-CM | POA: Diagnosis not present

## 2018-06-27 DIAGNOSIS — Z79899 Other long term (current) drug therapy: Secondary | ICD-10-CM | POA: Diagnosis not present

## 2018-06-27 DIAGNOSIS — I340 Nonrheumatic mitral (valve) insufficiency: Secondary | ICD-10-CM | POA: Diagnosis not present

## 2018-06-27 DIAGNOSIS — Z1159 Encounter for screening for other viral diseases: Secondary | ICD-10-CM | POA: Diagnosis not present

## 2018-06-27 DIAGNOSIS — I2510 Atherosclerotic heart disease of native coronary artery without angina pectoris: Secondary | ICD-10-CM | POA: Diagnosis not present

## 2018-06-27 DIAGNOSIS — D62 Acute posthemorrhagic anemia: Secondary | ICD-10-CM | POA: Diagnosis not present

## 2018-06-27 DIAGNOSIS — R079 Chest pain, unspecified: Secondary | ICD-10-CM | POA: Diagnosis not present

## 2018-06-27 DIAGNOSIS — Z91018 Allergy to other foods: Secondary | ICD-10-CM | POA: Diagnosis not present

## 2018-06-27 DIAGNOSIS — E669 Obesity, unspecified: Secondary | ICD-10-CM | POA: Diagnosis present

## 2018-06-27 DIAGNOSIS — Z6833 Body mass index (BMI) 33.0-33.9, adult: Secondary | ICD-10-CM | POA: Diagnosis not present

## 2018-06-27 DIAGNOSIS — I4510 Unspecified right bundle-branch block: Secondary | ICD-10-CM | POA: Diagnosis not present

## 2018-06-27 DIAGNOSIS — Z7982 Long term (current) use of aspirin: Secondary | ICD-10-CM | POA: Diagnosis not present

## 2018-06-27 DIAGNOSIS — Z951 Presence of aortocoronary bypass graft: Secondary | ICD-10-CM | POA: Diagnosis not present

## 2018-06-27 DIAGNOSIS — E785 Hyperlipidemia, unspecified: Secondary | ICD-10-CM | POA: Diagnosis not present

## 2018-06-27 LAB — BASIC METABOLIC PANEL
Anion gap: 11 (ref 5–15)
BUN: 14 mg/dL (ref 8–23)
CO2: 24 mmol/L (ref 22–32)
Calcium: 9 mg/dL (ref 8.9–10.3)
Chloride: 103 mmol/L (ref 98–111)
Creatinine, Ser: 1.79 mg/dL — ABNORMAL HIGH (ref 0.61–1.24)
GFR calc Af Amer: 38 mL/min — ABNORMAL LOW (ref >60)
GFR calc non Af Amer: 33 mL/min — ABNORMAL LOW (ref >60)
Glucose, Bld: 115 mg/dL — ABNORMAL HIGH (ref 70–99)
Potassium: 5.3 mmol/L — ABNORMAL HIGH (ref 3.5–5.1)
Sodium: 138 mmol/L (ref 135–145)

## 2018-06-27 LAB — MAGNESIUM: Magnesium: 2.1 mg/dL (ref 1.7–2.4)

## 2018-06-27 LAB — CBC
HCT: 43.7 % (ref 39.0–52.0)
Hemoglobin: 14.2 g/dL (ref 13.0–17.0)
MCH: 29.9 pg (ref 26.0–34.0)
MCHC: 32.5 g/dL (ref 30.0–36.0)
MCV: 92 fL (ref 80.0–100.0)
Platelets: 272 10*3/uL (ref 150–400)
RBC: 4.75 MIL/uL (ref 4.22–5.81)
RDW: 13.7 % (ref 11.5–15.5)
WBC: 12.2 10*3/uL — ABNORMAL HIGH (ref 4.0–10.5)
nRBC: 0 % (ref 0.0–0.2)

## 2018-06-27 LAB — HEPARIN LEVEL (UNFRACTIONATED): Heparin Unfractionated: 0.25 IU/mL — ABNORMAL LOW (ref 0.30–0.70)

## 2018-06-27 MED ORDER — SODIUM CHLORIDE 0.9 % IV SOLN
750.0000 mg | INTRAVENOUS | Status: DC
  Filled 2018-06-27: qty 750

## 2018-06-27 MED ORDER — HEPARIN (PORCINE) 25000 UT/250ML-% IV SOLN
1500.0000 [IU]/h | INTRAVENOUS | Status: DC
  Administered 2018-06-27: 12:00:00 1550 [IU]/h via INTRAVENOUS
  Administered 2018-06-27: 02:00:00 1400 [IU]/h via INTRAVENOUS
  Administered 2018-06-28: 01:00:00 1550 [IU]/h via INTRAVENOUS
  Administered 2018-06-28: 18:00:00 1500 [IU]/h via INTRAVENOUS
  Filled 2018-06-27 (×3): qty 250

## 2018-06-27 MED ORDER — EPINEPHRINE PF 1 MG/ML IJ SOLN
0.0000 ug/min | INTRAVENOUS | Status: DC
  Filled 2018-06-27: qty 4

## 2018-06-27 MED ORDER — PHENYLEPHRINE HCL-NACL 20-0.9 MG/250ML-% IV SOLN
30.0000 ug/min | INTRAVENOUS | Status: AC
  Administered 2018-06-29: 100 ug/min via INTRAVENOUS
  Filled 2018-06-27: qty 250

## 2018-06-27 MED ORDER — INSULIN REGULAR(HUMAN) IN NACL 100-0.9 UT/100ML-% IV SOLN
INTRAVENOUS | Status: AC
  Administered 2018-06-29: 2.5 [IU]/h via INTRAVENOUS
  Filled 2018-06-27: qty 100

## 2018-06-27 MED ORDER — MILRINONE LACTATE IN DEXTROSE 20-5 MG/100ML-% IV SOLN
0.3000 ug/kg/min | INTRAVENOUS | Status: DC
  Filled 2018-06-27: qty 100

## 2018-06-27 MED ORDER — TRANEXAMIC ACID (OHS) PUMP PRIME SOLUTION
2.0000 mg/kg | INTRAVENOUS | Status: DC
  Filled 2018-06-27: qty 2.05

## 2018-06-27 MED ORDER — NITROGLYCERIN IN D5W 200-5 MCG/ML-% IV SOLN
2.0000 ug/min | INTRAVENOUS | Status: DC
  Filled 2018-06-27: qty 250

## 2018-06-27 MED ORDER — DIAZEPAM 5 MG PO TABS
5.0000 mg | ORAL_TABLET | Freq: Every evening | ORAL | Status: DC | PRN
  Administered 2018-06-27: 22:00:00 5 mg via ORAL
  Filled 2018-06-27: qty 1

## 2018-06-27 MED ORDER — VANCOMYCIN HCL 1000 MG IV SOLR: INTRAVENOUS | Status: DC

## 2018-06-27 MED ORDER — PLASMA-LYTE 148 IV SOLN
INTRAVENOUS | Status: AC
  Administered 2018-06-29: 10:00:00
  Filled 2018-06-27: qty 2.5

## 2018-06-27 MED ORDER — SODIUM CHLORIDE 0.45 % IV SOLN
INTRAVENOUS | Status: DC
  Administered 2018-06-27 (×2): via INTRAVENOUS

## 2018-06-27 MED ORDER — NOREPINEPHRINE 4 MG/250ML-% IV SOLN
0.0000 ug/min | INTRAVENOUS | Status: DC
  Filled 2018-06-27: qty 250

## 2018-06-27 MED ORDER — POTASSIUM CHLORIDE 2 MEQ/ML IV SOLN
80.0000 meq | INTRAVENOUS | Status: DC
  Filled 2018-06-27: qty 40

## 2018-06-27 MED ORDER — MAGNESIUM SULFATE 50 % IJ SOLN
40.0000 meq | INTRAMUSCULAR | Status: DC
  Filled 2018-06-27: qty 9.85

## 2018-06-27 MED ORDER — SODIUM CHLORIDE 0.9 % IV SOLN
INTRAVENOUS | Status: DC
  Filled 2018-06-27: qty 30

## 2018-06-27 MED ORDER — TRANEXAMIC ACID 1000 MG/10ML IV SOLN
1.5000 mg/kg/h | INTRAVENOUS | Status: AC
  Administered 2018-06-29: 10:00:00 1.5 mg/kg/h via INTRAVENOUS
  Filled 2018-06-27: qty 25

## 2018-06-27 MED ORDER — PANTOPRAZOLE SODIUM 40 MG PO TBEC
40.0000 mg | DELAYED_RELEASE_TABLET | Freq: Every day | ORAL | Status: DC
  Administered 2018-06-27 – 2018-06-28 (×2): 40 mg via ORAL
  Filled 2018-06-27 (×2): qty 1

## 2018-06-27 MED ORDER — SODIUM CHLORIDE 0.9 % IV SOLN
1.5000 g | INTRAVENOUS | Status: AC
  Administered 2018-06-29: 1.5 g via INTRAVENOUS
  Filled 2018-06-27: qty 1.5

## 2018-06-27 MED ORDER — TRANEXAMIC ACID (OHS) BOLUS VIA INFUSION
15.0000 mg/kg | INTRAVENOUS | Status: AC
  Administered 2018-06-29: 1534.5 mg via INTRAVENOUS
  Filled 2018-06-27: qty 1535

## 2018-06-27 MED ORDER — DEXMEDETOMIDINE HCL IN NACL 400 MCG/100ML IV SOLN
0.1000 ug/kg/h | INTRAVENOUS | Status: AC
  Administered 2018-06-29: .5 ug/kg/h via INTRAVENOUS
  Filled 2018-06-27 (×2): qty 100

## 2018-06-27 MED ORDER — METOPROLOL TARTRATE 25 MG PO TABS
25.0000 mg | ORAL_TABLET | Freq: Two times a day (BID) | ORAL | Status: DC
  Administered 2018-06-27 – 2018-06-28 (×4): 25 mg via ORAL
  Filled 2018-06-27 (×4): qty 1

## 2018-06-27 MED ORDER — VANCOMYCIN HCL 10 G IV SOLR
1500.0000 mg | INTRAVENOUS | Status: AC
  Administered 2018-06-29: 1500 mg via INTRAVENOUS
  Filled 2018-06-27: qty 1500

## 2018-06-27 MED ORDER — DOPAMINE-DEXTROSE 3.2-5 MG/ML-% IV SOLN
0.0000 ug/kg/min | INTRAVENOUS | Status: AC
  Administered 2018-06-29: 2 ug/kg/min via INTRAVENOUS
  Filled 2018-06-27: qty 250

## 2018-06-27 NOTE — Progress Notes (Signed)
1 Day Post-Op Procedure(s) (LRB): LEFT HEART CATH AND CORONARY ANGIOGRAPHY (N/A) Subjective: No chest pain or shortness of breath. Sitting up eating.  Objective: Vital signs in last 24 hours: Temp:  [98.1 F (36.7 C)-98.4 F (36.9 C)] 98.4 F (36.9 C) (05/30 0432) Pulse Rate:  [66-81] 74 (05/30 1042) Cardiac Rhythm: Normal sinus rhythm (05/30 0700) Resp:  [8-25] 20 (05/30 0432) BP: (111-144)/(60-101) 127/60 (05/30 1042) SpO2:  [86 %-96 %] 94 % (05/30 0432) Weight:  [102.3 kg] 102.3 kg (05/30 0432)  Hemodynamic parameters for last 24 hours:    Intake/Output from previous day: 05/29 0701 - 05/30 0700 In: 1079 [P.O.:240; I.V.:839] Out: 725 [Urine:725] Intake/Output this shift: Total I/O In: -  Out: 250 [Urine:250]  General appearance: alert and cooperative Heart: regular rate and rhythm, S1, S2 normal, no murmur, click, rub or gallop Lungs: clear to auscultation bilaterally  Lab Results: Recent Labs    06/26/18 0252 06/27/18 0442  WBC 10.5 12.2*  HGB 14.4 14.2  HCT 44.3 43.7  PLT 272 272   BMET:  Recent Labs    06/26/18 0252 06/27/18 0442  NA 137 138  K 4.6 5.3*  CL 104 103  CO2 21* 24  GLUCOSE 131* 115*  BUN 16 14  CREATININE 1.63* 1.79*  CALCIUM 9.1 9.0    PT/INR:  Recent Labs    06/26/18 0252  LABPROT 14.6  INR 1.2   ABG No results found for: PHART, HCO3, TCO2, ACIDBASEDEF, O2SAT CBG (last 3)  No results for input(s): GLUCAP in the last 72 hours.  Assessment/Plan:  Severe multi-vessel CAD s/p NSTEMI.  Stage III CKD with creat up slightly today to 1.79. Cardiology has ordered some IVF. Will repeat in am and plan CABG Monday.  LOS: 0 days    Gaye Pollack 06/27/2018

## 2018-06-27 NOTE — Progress Notes (Signed)
[place] for heparin Indication: NSTEMI  Allergies  Allergen Reactions  . Levofloxacin     Patient Measurements: Height: 5\' 8"  (172.7 cm) Weight: 225 lb 8 oz (102.3 kg) IBW/kg (Calculated) : 68.4 Heparin Dosing Weight: 90.9 kg  Vital Signs: Temp: 98.4 F (36.9 C) (05/30 0432) Temp Source: Oral (05/30 0432) BP: 127/60 (05/30 1042) Pulse Rate: 74 (05/30 1042)  Labs: Recent Labs    06/26/18 0252 06/26/18 0704 06/26/18 1106 06/27/18 0442 06/27/18 1020  HGB 14.4  --   --  14.2  --   HCT 44.3  --   --  43.7  --   PLT 272  --   --  272  --   APTT 72*  --   --   --   --   LABPROT 14.6  --   --   --   --   INR 1.2  --   --   --   --   HEPARINUNFRC  --   --  0.17*  --  0.25*  CREATININE 1.63*  --   --  1.79*  --   TROPONINI 0.21* 3.00*  --   --   --     Estimated Creatinine Clearance: 31.8 mL/min (A) (by C-G formula based on SCr of 1.79 mg/dL (H)).   Assessment: 85 yo man transferred from [facility] to continue heparin drip for NSTEMI. He was not on anticoagulation PTA.  S/p cath - awaiting CABG 6/1.    Goal of Therapy:  Heparin level 0.3-0.7 units/ml Monitor platelets by anticoagulation protocol: Yes   Plan:  Increase heparin to 1550 units / hr Daily heparin level and CBC while on heparin Monitor for bleeding complications  Thank you Anette Guarneri, PharmD [phone] Please utilize Amion for appropriate phone number to reach the unit pharmacist ([facility])

## 2018-06-27 NOTE — Progress Notes (Signed)
[id number] Discussed with pt the importance of mobility and IS after surgery. Gave pt IS and worked with him until he demonstrated 1500 ml correctly. Pt stood and sat back down without use of arms. Discussed sternal precautions. Gave pt [facility] booklet, care guide and in the tube handout. Discussed with pt how to view pre op video. Pt stated wife will be able to help with care after discharge. Pt thankful for ed and stated will read materials. Graylon Good RN BSN 06/27/2018 2:17 PM

## 2018-06-27 NOTE — Progress Notes (Signed)
Progress Note  Patient Name: John Palmer [facility] Date of Encounter: 06/27/2018  Primary Cardiologist: No primary care provider on file.   Subjective   He denies chest pain, palpitations, shortness of breath.  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  He and his wife used to travel to [place] and stay there during the winters.  Inpatient Medications    Scheduled Meds: . aspirin  81 mg Oral Daily  . atorvastatin  80 mg Oral q1800  . metoprolol tartrate  12.5 mg Oral BID  . sodium chloride flush  3 mL Intravenous Q12H  . sodium chloride flush  3 mL Intravenous Q12H   Continuous Infusions: . sodium chloride    . sodium chloride    . heparin 1,400 Units/hr (06/27/18 0229)   PRN Meds: sodium chloride, acetaminophen, nitroGLYCERIN, ondansetron (ZOFRAN) IV, oxyCODONE, sodium chloride flush   Vital Signs    Vitals:   06/26/18 1635 06/26/18 1637 06/26/18 2008 06/27/18 0432  BP: 124/60 131/66 117/63 123/69  Pulse: 74 70 77 77  Resp: (!) 23 13 20 20   Temp:   98.1 F (36.7 C) 98.4 F (36.9 C)  TempSrc:   Oral Oral  SpO2: (!) 88% (!) 89% 96% 94%  Weight:    102.3 kg  Height:        Intake/Output Summary (Last 24 hours) at 06/27/2018 0946 Last data filed at 06/27/2018 0649 Gross per 24 hour  Intake 1079.01 ml  Output 725 ml  Net 354.01 ml   Filed Weights   06/26/18 0415 06/26/18 1000 06/27/18 0432  Weight: 102.4 kg 101.6 kg 102.3 kg    Telemetry    Sinus rhythm, PVCs, 10 beat run of NSVT last night- Personally Reviewed   Physical Exam   GEN: No acute distress.   Neck: No JVD Cardiac: RRR, no murmurs, rubs, or gallops.  Respiratory: Clear to auscultation bilaterally. GI: Soft, nontender, non-distended  MS: No edema; No deformity. Neuro:  Nonfocal  Psych: Normal affect   Labs    Chemistry Recent Labs  Lab 06/26/18 0252 06/27/18 0442  NA 137 138  K 4.6 5.3*  CL 104 103  CO2 21* 24  GLUCOSE 131* 115*  BUN 16 14  CREATININE  1.63* 1.79*  CALCIUM 9.1 9.0  GFRNONAA 37* 33*  GFRAA 42* 38*  ANIONGAP 12 11     Hematology Recent Labs  Lab 06/26/18 0252 06/27/18 0442  WBC 10.5 12.2*  RBC 4.89 4.75  HGB 14.4 14.2  HCT 44.3 43.7  MCV 90.6 92.0  MCH 29.4 29.9  MCHC 32.5 32.5  RDW 13.5 13.7  PLT 272 272    Cardiac Enzymes Recent Labs  Lab 06/26/18 0252 06/26/18 0704  TROPONINI 0.21* 3.00*   No results for input(s): TROPIPOC in the last 168 hours.   BNPNo results for input(s): BNP, PROBNP in the last 168 hours.   DDimer No results for input(s): DDIMER in the last 168 hours.   Radiology    Dg Chest Port 1 View  Result Date: 06/26/2018 CLINICAL DATA:  Chest pain EXAM: PORTABLE CHEST 1 VIEW COMPARISON:  06/26/2018 FINDINGS: Cardiac shadow is mildly prominent but accentuated by the portable technique. Aortic calcifications are noted. Lungs are well aerated bilaterally. No focal infiltrate or sizable effusion is seen. No acute abnormality is noted. IMPRESSION: No acute abnormality noted. Electronically Signed   By: Inez Catalina M.D.   On: 06/26/2018 03:06    Cardiac Studies  Coronary angiography 06/26/2018:   Severe, calcified, coronary artery disease with subtotally occluded ostial to proximal RCA within a "[place]pherd[place]ok".  RCA fills by left-to-right collaterals and also antegrade.  50% left main.  Proximal to mid 90% LAD.  The significant diagonal contains 80% stenosis.  The LAD beyond the diagonal is totally occluded and fills by left to left collaterals.  2 obtuse marginals arise from the circumflex and are the sources of collaterals to the right coronary and LAD.  Elevated LVEDP, 25 mmHg.  RECOMMENDATIONS:   No interventional options exist.  Discussed with treating team, Dr. Martinique.    Management options would include medical therapy versus high risk CABG.     Echocardiogram 06/26/2018:   1. The left ventricle has normal systolic function with an ejection fraction of 60-65%.  The cavity size was normal. Left ventricular diastolic Doppler parameters are consistent with pseudonormalization.  2. The right ventricle has low normal systolic function. The cavity was mildly enlarged. There is no increase in right ventricular wall thickness.  3. Left atrial size was mildly dilated.  4. Right atrial size was mildly dilated.  5. Mitral valve regurgitation is mild to moderate by color flow Doppler.  6. Moderate thickening of the aortic valve. Moderate calcification of the aortic valve. Aortic valve regurgitation is mild by color flow Doppler. Moderate aortic annular calcification noted.  7. The tricuspid valve is not well visualized. Tricuspid valve regurgitation was not assessed by color flow Doppler.  8. The aortic root and ascending aorta are normal in size and structure.  9. The interatrial septum was not assessed.  Patient Profile     85 y.o. male with a history of hyperlipidemia who presented with non-STEMI  Assessment & Plan    1.  Coronary artery disease with non-STEMI: Severe multivessel coronary disease by coronary angiography on 06/26/2018.  Evaluated by CT surgery.  Plans for CABG on 06/29/2018.  Renal function has worsened.  I will give IV fluids.  He is having runs of nonsustained ventricular tachycardia.  I will increase metoprolol to 25 mg twice daily.  Continue aspirin, heparin, and atorvastatin.  Symptomatically stable.  2.  Hyperlipidemia: LDL 89.  Currently on atorvastatin 80 mg.  3.  Nonsustained ventricular tachycardia: Potassium is mildly elevated at 5.3.  Renal function is worsened.  I will administer IV fluids.  I will increase Lopressor to 25 mg twice daily.  I will check a serum magnesium level.  4.  Chronic kidney disease stage III: Creatinine up to 1.79 today from 1.63 yesterday.  He underwent cardiac catheterization on 06/26/2018.  This may be the results of contrast-induced nephropathy.  I will give IV fluids today.   For questions or updates,  please contact [facility] Please consult [email or web address] for contact info under Cardiology/STEMI.      Signed, Kate Sable, MD  06/27/2018, 9:46 AM

## 2018-06-28 ENCOUNTER — Encounter (HOSPITAL_COMMUNITY): Payer: Self-pay | Admitting: Certified Registered Nurse Anesthetist

## 2018-06-28 LAB — BASIC METABOLIC PANEL
Anion gap: 13 (ref 5–15)
BUN: 18 mg/dL (ref 8–23)
CO2: 26 mmol/L (ref 22–32)
Calcium: 9.6 mg/dL (ref 8.9–10.3)
Chloride: 99 mmol/L (ref 98–111)
Creatinine, Ser: 1.71 mg/dL — ABNORMAL HIGH (ref 0.61–1.24)
GFR calc Af Amer: 40 mL/min — ABNORMAL LOW (ref >60)
GFR calc non Af Amer: 34 mL/min — ABNORMAL LOW (ref >60)
Glucose, Bld: 119 mg/dL — ABNORMAL HIGH (ref 70–99)
Potassium: 4.4 mmol/L (ref 3.5–5.1)
Sodium: 138 mmol/L (ref 135–145)

## 2018-06-28 LAB — CBC
HCT: 44.2 % (ref 39.0–52.0)
Hemoglobin: 14.5 g/dL (ref 13.0–17.0)
MCH: 29.6 pg (ref 26.0–34.0)
MCHC: 32.8 g/dL (ref 30.0–36.0)
MCV: 90.2 fL (ref 80.0–100.0)
Platelets: 258 10*3/uL (ref 150–400)
RBC: 4.9 MIL/uL (ref 4.22–5.81)
RDW: 13.7 % (ref 11.5–15.5)
WBC: 11 10*3/uL — ABNORMAL HIGH (ref 4.0–10.5)
nRBC: 0 % (ref 0.0–0.2)

## 2018-06-28 LAB — HEMOGLOBIN AND HEMATOCRIT, BLOOD
HCT: 42.9 % (ref 39.0–52.0)
Hemoglobin: 14 g/dL (ref 13.0–17.0)

## 2018-06-28 LAB — URINALYSIS, ROUTINE W REFLEX MICROSCOPIC
Bilirubin Urine: NEGATIVE
Glucose, UA: NEGATIVE mg/dL
Hgb urine dipstick: NEGATIVE
Ketones, ur: NEGATIVE mg/dL
Leukocytes,Ua: NEGATIVE
Nitrite: NEGATIVE
Protein, ur: NEGATIVE mg/dL
Specific Gravity, Urine: 1.006 (ref 1.005–1.030)
pH: 7 (ref 5.0–8.0)

## 2018-06-28 LAB — BLOOD GAS, ARTERIAL
Acid-base deficit: 1.3 mmol/L (ref 0.0–2.0)
Bicarbonate: 22.5 mmol/L (ref 20.0–28.0)
Drawn by: 244901
FIO2: 21
O2 Saturation: 93.9 %
Patient temperature: 98.2
pCO2 arterial: 35.1 mmHg (ref 32.0–48.0)
pH, Arterial: 7.423 (ref 7.350–7.450)
pO2, Arterial: 70.3 mmHg — ABNORMAL LOW (ref 83.0–108.0)

## 2018-06-28 LAB — HEPARIN LEVEL (UNFRACTIONATED)
Heparin Unfractionated: 0.67 IU/mL (ref 0.30–0.70)
Heparin Unfractionated: 0.72 IU/mL — ABNORMAL HIGH (ref 0.30–0.70)

## 2018-06-28 LAB — SURGICAL PCR SCREEN
MRSA, PCR: NEGATIVE
Staphylococcus aureus: NEGATIVE

## 2018-06-28 LAB — ABO/RH: ABO/RH(D): O NEG

## 2018-06-28 LAB — HEMOGLOBIN A1C
Hgb A1c MFr Bld: 6.1 % — ABNORMAL HIGH (ref 4.8–5.6)
Mean Plasma Glucose: 128.37 mg/dL

## 2018-06-28 MED ORDER — MUPIROCIN 2 % EX OINT
1.0000 "application " | TOPICAL_OINTMENT | Freq: Two times a day (BID) | CUTANEOUS | Status: DC
  Administered 2018-06-28 – 2018-06-30 (×4): 1 via NASAL
  Filled 2018-06-28 (×3): qty 22

## 2018-06-28 MED ORDER — MOVING RIGHT ALONG BOOK
Freq: Once | Status: AC
  Administered 2018-06-29: 1
  Filled 2018-06-28: qty 1

## 2018-06-28 MED ORDER — LORAZEPAM 2 MG/ML IJ SOLN
1.0000 mg | Freq: Once | INTRAMUSCULAR | Status: AC
  Administered 2018-06-28: 03:00:00 1 mg via INTRAVENOUS
  Filled 2018-06-28: qty 1

## 2018-06-28 MED ORDER — CHLORHEXIDINE GLUCONATE CLOTH 2 % EX PADS
6.0000 | MEDICATED_PAD | Freq: Once | CUTANEOUS | Status: AC
  Administered 2018-06-28: 22:00:00 6 via TOPICAL

## 2018-06-28 MED ORDER — BISACODYL 5 MG PO TBEC
5.0000 mg | DELAYED_RELEASE_TABLET | Freq: Once | ORAL | Status: AC
  Administered 2018-06-28: 15:00:00 5 mg via ORAL
  Filled 2018-06-28: qty 1

## 2018-06-28 MED ORDER — CHLORHEXIDINE GLUCONATE CLOTH 2 % EX PADS
6.0000 | MEDICATED_PAD | Freq: Once | CUTANEOUS | Status: AC
  Administered 2018-06-29: 6 via TOPICAL

## 2018-06-28 MED ORDER — CHLORHEXIDINE GLUCONATE 4 % EX LIQD
Freq: Once | CUTANEOUS | Status: AC
  Administered 2018-06-29: 05:00:00 via TOPICAL

## 2018-06-28 MED ORDER — CHLORHEXIDINE GLUCONATE 4 % EX LIQD
CUTANEOUS | Status: AC
  Administered 2018-06-28: 22:00:00
  Filled 2018-06-28: qty 120

## 2018-06-28 MED ORDER — METOPROLOL TARTRATE 12.5 MG HALF TABLET
12.5000 mg | ORAL_TABLET | Freq: Once | ORAL | Status: AC
  Administered 2018-06-29: 05:00:00 12.5 mg via ORAL
  Filled 2018-06-28: qty 1

## 2018-06-28 MED ORDER — CHLORHEXIDINE GLUCONATE 0.12 % MT SOLN
15.0000 mL | Freq: Once | OROMUCOSAL | Status: AC
  Administered 2018-06-29: 05:00:00 15 mL via OROMUCOSAL
  Filled 2018-06-28: qty 15

## 2018-06-28 MED ORDER — ~~LOC~~ CARDIAC SURGERY, PATIENT & FAMILY EDUCATION
Freq: Once | Status: AC
  Administered 2018-06-29: 1
  Filled 2018-06-28: qty 1

## 2018-06-28 MED ORDER — TEMAZEPAM 7.5 MG PO CAPS
15.0000 mg | ORAL_CAPSULE | Freq: Once | ORAL | Status: AC | PRN
  Administered 2018-06-28: 23:00:00 15 mg via ORAL
  Filled 2018-06-28: qty 2

## 2018-06-28 NOTE — Progress Notes (Signed)
Progress Note  Patient Name: John Palmer Date of Encounter: 06/28/2018  Primary Cardiologist: No primary care provider on file.   Subjective   He is doing well this morning denies chest pain, palpitations, shortness of breath.  Earlier this morning he was confused and anxious about his upcoming surgery and was given Valium and Ativan.  He said he felt better afterwards.  I spoke with the patient's son and wife via the patient's cell phone and answered their questions.  Inpatient Medications    Scheduled Meds: . aspirin  81 mg Oral Daily  . atorvastatin  80 mg Oral q1800  . [START ON 06/29/2018] heparin-papaverine-plasmalyte irrigation   Irrigation To OR  . [START ON 06/29/2018] insulin   Intravenous To OR  . [START ON 06/29/2018] magnesium sulfate  40 mEq Other To OR  . metoprolol tartrate  25 mg Oral BID  . pantoprazole  40 mg Oral Daily  . [START ON 06/29/2018] phenylephrine  30-200 mcg/min Intravenous To OR  . [START ON 06/29/2018] potassium chloride  80 mEq Other To OR  . sodium chloride flush  3 mL Intravenous Q12H  . sodium chloride flush  3 mL Intravenous Q12H  . [START ON 06/29/2018] tranexamic acid  15 mg/kg Intravenous To OR  . [START ON 06/29/2018] tranexamic acid  2 mg/kg Intracatheter To OR   Continuous Infusions: . sodium chloride 50 mL/hr at 06/27/18 2202  . sodium chloride    . [START ON 06/29/2018] cefUROXime (ZINACEF)  IV    . [START ON 06/29/2018] cefUROXime (ZINACEF)  IV    . [START ON 06/29/2018] dexmedetomidine    . [START ON 06/29/2018] DOPamine    . [START ON 06/29/2018] epinephrine    . [START ON 06/29/2018] heparin 30,000 units/NS 1000 mL solution for CELLSAVER    . heparin 1,550 Units/hr (06/28/18 0049)  . [START ON 06/29/2018] milrinone    . [START ON 06/29/2018] nitroGLYCERIN    . [START ON 06/29/2018] norepinephrine    . [START ON 06/29/2018] tranexamic acid (CYKLOKAPRON) infusion ([facility])    . [START ON 06/29/2018] vancomycin     PRN Meds: sodium chloride, acetaminophen,  diazepam, nitroGLYCERIN, ondansetron (ZOFRAN) IV, oxyCODONE, sodium chloride flush   Vital Signs    Vitals:   06/27/18 1403 06/27/18 2017 06/27/18 2158 06/28/18 0434  BP: 132/60 130/72  132/86  Pulse:  80 73 84  Resp:      Temp: 98.1 F (36.7 C) 98.3 F (36.8 C)  98.4 F (36.9 C)  TempSrc: Oral Oral  Oral  SpO2:  95%  95%  Weight:    106.1 kg  Height:        Intake/Output Summary (Last 24 hours) at 06/28/2018 0805 Last data filed at 06/28/2018 0600 Gross per 24 hour  Intake 240 ml  Output 550 ml  Net -310 ml   Filed Weights   06/26/18 1000 06/27/18 0432 06/28/18 0434  Weight: 101.6 kg 102.3 kg 106.1 kg    Telemetry    Sinus rhythm with PVCs- Personally Reviewed   Physical Exam   GEN: No acute distress.   Neck: No JVD Cardiac: RRR, no murmurs, rubs, or gallops.  Respiratory: Clear to auscultation bilaterally. GI: Soft, nontender, non-distended  MS: No edema; No deformity. Neuro:  Nonfocal  Psych: Normal affect   Labs    Chemistry Recent Labs  Lab 06/26/18 0252 06/27/18 0442 06/28/18 0612  NA 137 138 138  K 4.6 5.3* 4.4  CL 104 103 99  CO2 21*  24 26  GLUCOSE 131* 115* 119*  BUN 16 14 18   CREATININE 1.63* 1.79* 1.71*  CALCIUM 9.1 9.0 9.6  GFRNONAA 37* 33* 34*  GFRAA 42* 38* 40*  ANIONGAP 12 11 13      Hematology Recent Labs  Lab 06/26/18 0252 06/27/18 0442 06/28/18 0612  WBC 10.5 12.2* 11.0*  RBC 4.89 4.75 4.90  HGB 14.4 14.2 14.5  HCT 44.3 43.7 44.2  MCV 90.6 92.0 90.2  MCH 29.4 29.9 29.6  MCHC 32.5 32.5 32.8  RDW 13.5 13.7 13.7  PLT 272 272 258    Cardiac Enzymes Recent Labs  Lab 06/26/18 0252 06/26/18 0704  TROPONINI 0.21* 3.00*   No results for input(s): TROPIPOC in the last 168 hours.   BNPNo results for input(s): BNP, PROBNP in the last 168 hours.   DDimer No results for input(s): DDIMER in the last 168 hours.   Radiology    No results found.  Cardiac Studies   Coronary angiography 06/26/2018:   Severe,  calcified, coronary artery disease with subtotally occluded ostial to proximal RCA within a "[place]ok". RCA fills by left-to-right collaterals and also antegrade.  50% left main.  Proximal to mid 90% LAD. The significant diagonal contains 80% stenosis. The LAD beyond the diagonal is totally occluded and fills by left to left collaterals.  2 obtuse marginals arise from the circumflex and are the sources of collaterals to the right coronary and LAD.  Elevated LVEDP, 25 mmHg.  RECOMMENDATIONS:   No interventional options exist.  Discussed with treating team, Dr. Martinique.   Management options would include medical therapy versus high risk CABG.     Echocardiogram 06/26/2018:  1. The left ventricle has normal systolic function with an ejection fraction of 60-65%. The cavity size was normal. Left ventricular diastolic Doppler parameters are consistent with pseudonormalization. 2. The right ventricle has low normal systolic function. The cavity was mildly enlarged. There is no increase in right ventricular wall thickness. 3. Left atrial size was mildly dilated. 4. Right atrial size was mildly dilated. 5. Mitral valve regurgitation is mild to moderate by color flow Doppler. 6. Moderate thickening of the aortic valve. Moderate calcification of the aortic valve. Aortic valve regurgitation is mild by color flow Doppler. Moderate aortic annular calcification noted. 7. The tricuspid valve is not well visualized. Tricuspid valve regurgitation was not assessed by color flow Doppler. 8. The aortic root and ascending aorta are normal in size and structure. 9. The interatrial septum was not assessed.  Patient Profile     85 y.o. male with a history of hyperlipidemia who presented with non-STEMI.  Assessment & Plan    1.  Coronary artery disease with non-STEMI: Severe multivessel coronary disease by coronary angiography on 06/26/2018.  Evaluated by CT surgery.  Plans for  CABG on 06/29/2018.  Renal function is gradually improving with IV fluids.  Continue metoprolol to 25 mg twice daily, aspirin, heparin, and atorvastatin. No significant NSVT.  Symptomatically stable.  2.  Hyperlipidemia: LDL 89.  Currently on atorvastatin 80 mg.  3.  Nonsustained ventricular tachycardia: Potassium has normalized with IV fluids.  Renal function is gradually improving.  I increased Lopressor to 25 mg twice daily on 5/30.   Magnesium level normal, 2.1.  4.  Chronic kidney disease stage III: Creatinine down to 1.71 today from 1.79 yesterday with IV fluids.  He underwent cardiac catheterization on 06/26/2018.  This may be the results of contrast-induced nephropathy.    For questions or updates, please contact  [facility] Please consult [email or web address] for contact info under Cardiology/STEMI.      Signed, Kate Sable, MD  06/28/2018, 8:05 AM

## 2018-06-28 NOTE — Progress Notes (Signed)
Patient is confuse, anxious and extremely worried about the upcoming surgery. Reorientation was unsuccessful. He did not sleep at all last night. PRN Valium 5 mg given for sleep and Ativan 1 mg for anxiety were not effective. He is currently in bed with eyes open.  Will continue to monitor.

## 2018-06-28 NOTE — Progress Notes (Signed)
ANTICOAGULATION CONSULT NOTE  Pharmacy Consult for heparin Indication: NSTEMI  Patient Measurements: Height: 5\' 8"  (172.7 cm) Weight: 233 lb 14.5 oz (106.1 kg) IBW/kg (Calculated) : 68.4 Heparin Dosing Weight: 90.9 kg  Vital Signs: Temp: 98.2 F (36.8 C) (05/31 1459) Temp Source: Axillary (05/31 1459) BP: 128/67 (05/31 1459) Pulse Rate: 72 (05/31 1459)  Labs: Recent Labs    06/26/18 0252 06/26/18 0704  06/27/18 0442 06/27/18 1020 06/28/18 0612 06/28/18 1058 06/28/18 1430  HGB 14.4  --   --  14.2  --  14.5 14.0  --   HCT 44.3  --   --  43.7  --  44.2 42.9  --   PLT 272  --   --  272  --  258  --   --   APTT 72*  --   --   --   --   --   --   --   LABPROT 14.6  --   --   --   --   --   --   --   INR 1.2  --   --   --   --   --   --   --   HEPARINUNFRC  --   --    < >  --  0.25* 0.67  --  0.72*  CREATININE 1.63*  --   --  1.79*  --  1.71*  --   --   TROPONINI 0.21* 3.00*  --   --   --   --   --   --    < > = values in this interval not displayed.    Estimated Creatinine Clearance: 33.9 mL/min (A) (by C-G formula based on SCr of 1.71 mg/dL (H)).   Assessment: 85 yo man transferred from [facility] to continue heparin drip for NSTEMI. He was not on anticoagulation PTA.  S/p cath - awaiting CABG 6/1.   Heparin level is now slightly above goal. No bleeding noted.   Goal of Therapy:  Heparin level 0.3-0.7 units/ml Monitor platelets by anticoagulation protocol: Yes   Plan:  Reduce heparin gtt to 1500 units/hr Daily heparin level and CBC while on heparin Monitor for bleeding complications  Salome Arnt, PharmD, BCPS Please see AMION for all pharmacy numbers 06/28/2018 4:14 PM

## 2018-06-28 NOTE — Progress Notes (Signed)
[place] for heparin Indication: NSTEMI  Allergies  Allergen Reactions  . Levofloxacin     UNSPECIFIED REACTION     Patient Measurements: Height: 5\' 8"  (172.7 cm) Weight: 233 lb 14.5 oz (106.1 kg) IBW/kg (Calculated) : 68.4 Heparin Dosing Weight: 90.9 kg  Vital Signs: Temp: 98.4 F (36.9 C) (05/31 0434) Temp Source: Oral (05/31 0434) BP: 132/86 (05/31 0434) Pulse Rate: 84 (05/31 0434)  Labs: Recent Labs    06/26/18 0252 06/26/18 0704 06/26/18 1106 06/27/18 0442 06/27/18 1020 06/28/18 0612  HGB 14.4  --   --  14.2  --  14.5  HCT 44.3  --   --  43.7  --  44.2  PLT 272  --   --  272  --  258  APTT 72*  --   --   --   --   --   LABPROT 14.6  --   --   --   --   --   INR 1.2  --   --   --   --   --   HEPARINUNFRC  --   --  0.17*  --  0.25* 0.67  CREATININE 1.63*  --   --  1.79*  --  1.71*  TROPONINI 0.21* 3.00*  --   --   --   --     Estimated Creatinine Clearance: 33.9 mL/min (A) (by C-G formula based on SCr of 1.71 mg/dL (H)).   Assessment: 85 yo man transferred from [facility] to continue heparin drip for NSTEMI. He was not on anticoagulation PTA.  S/p cath - awaiting CABG 6/1.   Heparin level therapeutic this AM.  No overt bleeding or complications noted.  CBC stable.  Goal of Therapy:  Heparin level 0.3-0.7 units/ml Monitor platelets by anticoagulation protocol: Yes   Plan:  Continue heparin at current rate. Confirm heparin level in 8 hrs. Daily heparin level and CBC while on heparin Monitor for bleeding complications  Marguerite Olea, [facility] Clinical Pharmacist Phone [phone]  06/28/2018 8:34 AM

## 2018-06-28 NOTE — Progress Notes (Signed)
2 Days Post-Op Procedure(s) (LRB): LEFT HEART CATH AND CORONARY ANGIOGRAPHY (N/A) Subjective: Only complaint is of not sleeping well. Uses CPAP at home every night which he does not have here. No chest or arm pain, no SOB Objective: Vital signs in last 24 hours: Temp:  [98.1 F (36.7 C)-98.4 F (36.9 C)] 98.4 F (36.9 C) (05/31 0434) Pulse Rate:  [73-84] 84 (05/31 0434) Cardiac Rhythm: Heart block (05/31 0700) BP: (130-132)/(60-86) 132/86 (05/31 0434) SpO2:  [95 %] 95 % (05/31 0434) Weight:  [106.1 kg] 106.1 kg (05/31 0434)  Hemodynamic parameters for last 24 hours:    Intake/Output from previous day: 05/30 0701 - 05/31 0700 In: 240 [P.O.:240] Out: 550 [Urine:550] Intake/Output this shift: No intake/output data recorded.  General appearance: alert and cooperative Heart: regular rate and rhythm, S1, S2 normal, no murmur Lungs: clear to auscultation bilaterally  Lab Results: Recent Labs    06/27/18 0442 06/28/18 0612 06/28/18 1058  WBC 12.2* 11.0*  --   HGB 14.2 14.5 14.0  HCT 43.7 44.2 42.9  PLT 272 258  --    BMET:  Recent Labs    06/27/18 0442 06/28/18 0612  NA 138 138  K 5.3* 4.4  CL 103 99  CO2 24 26  GLUCOSE 115* 119*  BUN 14 18  CREATININE 1.79* 1.71*  CALCIUM 9.0 9.6    PT/INR:  Recent Labs    06/26/18 0252  LABPROT 14.6  INR 1.2   ABG No results found for: PHART, HCO3, TCO2, ACIDBASEDEF, O2SAT CBG (last 3)  No results for input(s): GLUCAP in the last 72 hours.  Assessment/Plan:  Severe multi-vessel coronary artery disease His creat is stable at 1.7 which is his baseline. Plan CABG in am. I discussed procedure again with the patient as well as wife and son on the phone and answered their questions.   LOS: 1 day    Gaye Pollack 06/28/2018

## 2018-06-29 ENCOUNTER — Inpatient Hospital Stay (HOSPITAL_COMMUNITY): Payer: Medicare Other | Admitting: Certified Registered Nurse Anesthetist

## 2018-06-29 ENCOUNTER — Inpatient Hospital Stay (HOSPITAL_COMMUNITY): Payer: Medicare Other

## 2018-06-29 ENCOUNTER — Inpatient Hospital Stay (HOSPITAL_COMMUNITY)
Admission: EM | Disposition: A | Discharge status: Home Health | Payer: Self-pay | Source: Home / Self Care | Attending: Surgery

## 2018-06-29 ENCOUNTER — Encounter (HOSPITAL_COMMUNITY): Payer: Self-pay | Admitting: Certified Registered Nurse Anesthetist

## 2018-06-29 DIAGNOSIS — I2510 Atherosclerotic heart disease of native coronary artery without angina pectoris: Secondary | ICD-10-CM | POA: Diagnosis present

## 2018-06-29 HISTORY — PX: TEE WITHOUT CARDIOVERSION: SHX5443

## 2018-06-29 HISTORY — PX: CORONARY ARTERY BYPASS GRAFT: SHX141

## 2018-06-29 LAB — POCT I-STAT 7, (LYTES, BLD GAS, ICA,H+H)
Acid-base deficit: 7 mmol/L — ABNORMAL HIGH (ref 0.0–2.0)
Acid-base deficit: 5 mmol/L — ABNORMAL HIGH (ref 0.0–2.0)
Acid-base deficit: 5 mmol/L — ABNORMAL HIGH (ref 0.0–2.0)
Acid-base deficit: 4 mmol/L — ABNORMAL HIGH (ref 0.0–2.0)
Bicarbonate: 20 mmol/L (ref 20.0–28.0)
Bicarbonate: 24.8 mmol/L (ref 20.0–28.0)
Bicarbonate: 20.1 mmol/L (ref 20.0–28.0)
Bicarbonate: 19.9 mmol/L — ABNORMAL LOW (ref 20.0–28.0)
Bicarbonate: 20.2 mmol/L (ref 20.0–28.0)
Calcium, Ion: 1.02 mmol/L — ABNORMAL LOW (ref 1.15–1.40)
Calcium, Ion: 1.06 mmol/L — ABNORMAL LOW (ref 1.15–1.40)
Calcium, Ion: 1.03 mmol/L — ABNORMAL LOW (ref 1.15–1.40)
Calcium, Ion: 1.1 mmol/L — ABNORMAL LOW (ref 1.15–1.40)
Calcium, Ion: 1.07 mmol/L — ABNORMAL LOW (ref 1.15–1.40)
HCT: 28 % — ABNORMAL LOW (ref 39.0–52.0)
HCT: 30 % — ABNORMAL LOW (ref 39.0–52.0)
HCT: 31 % — ABNORMAL LOW (ref 39.0–52.0)
HCT: 31 % — ABNORMAL LOW (ref 39.0–52.0)
HCT: 27 % — ABNORMAL LOW (ref 39.0–52.0)
Hemoglobin: 9.5 g/dL — ABNORMAL LOW (ref 13.0–17.0)
Hemoglobin: 10.2 g/dL — ABNORMAL LOW (ref 13.0–17.0)
Hemoglobin: 10.5 g/dL — ABNORMAL LOW (ref 13.0–17.0)
Hemoglobin: 10.5 g/dL — ABNORMAL LOW (ref 13.0–17.0)
Hemoglobin: 9.2 g/dL — ABNORMAL LOW (ref 13.0–17.0)
O2 Saturation: 100 %
O2 Saturation: 100 %
O2 Saturation: 98 %
O2 Saturation: 98 %
O2 Saturation: 97 %
Patient temperature: 36.4
Patient temperature: 37.4
Patient temperature: 37.3
Potassium: 3.8 mmol/L (ref 3.5–5.1)
Potassium: 5.3 mmol/L — ABNORMAL HIGH (ref 3.5–5.1)
Potassium: 3.8 mmol/L (ref 3.5–5.1)
Potassium: 4.4 mmol/L (ref 3.5–5.1)
Potassium: 4.3 mmol/L (ref 3.5–5.1)
Sodium: 137 mmol/L (ref 135–145)
Sodium: 136 mmol/L (ref 135–145)
Sodium: 141 mmol/L (ref 135–145)
Sodium: 138 mmol/L (ref 135–145)
Sodium: 139 mmol/L (ref 135–145)
TCO2: 21 mmol/L — ABNORMAL LOW (ref 22–32)
TCO2: 26 mmol/L (ref 22–32)
TCO2: 21 mmol/L — ABNORMAL LOW (ref 22–32)
TCO2: 21 mmol/L — ABNORMAL LOW (ref 22–32)
TCO2: 21 mmol/L — ABNORMAL LOW (ref 22–32)
pCO2 arterial: 47.8 mmHg (ref 32.0–48.0)
pCO2 arterial: 38.8 mmHg (ref 32.0–48.0)
pCO2 arterial: 33.9 mmHg (ref 32.0–48.0)
pCO2 arterial: 34.9 mmHg (ref 32.0–48.0)
pCO2 arterial: 33 mmHg (ref 32.0–48.0)
pH, Arterial: 7.229 — ABNORMAL LOW (ref 7.350–7.450)
pH, Arterial: 7.414 (ref 7.350–7.450)
pH, Arterial: 7.377 (ref 7.350–7.450)
pH, Arterial: 7.367 (ref 7.350–7.450)
pH, Arterial: 7.397 (ref 7.350–7.450)
pO2, Arterial: 254 mmHg — ABNORMAL HIGH (ref 83.0–108.0)
pO2, Arterial: 274 mmHg — ABNORMAL HIGH (ref 83.0–108.0)
pO2, Arterial: 107 mmHg (ref 83.0–108.0)
pO2, Arterial: 116 mmHg — ABNORMAL HIGH (ref 83.0–108.0)
pO2, Arterial: 88 mmHg (ref 83.0–108.0)

## 2018-06-29 LAB — POCT I-STAT, CHEM 8
BUN: 20 mg/dL (ref 8–23)
Calcium, Ion: 1.09 mmol/L — ABNORMAL LOW (ref 1.15–1.40)
Chloride: 106 mmol/L (ref 98–111)
Creatinine, Ser: 1.6 mg/dL — ABNORMAL HIGH (ref 0.61–1.24)
Glucose, Bld: 143 mg/dL — ABNORMAL HIGH (ref 70–99)
HCT: 30 % — ABNORMAL LOW (ref 39.0–52.0)
Hemoglobin: 10.2 g/dL — ABNORMAL LOW (ref 13.0–17.0)
Potassium: 4.5 mmol/L (ref 3.5–5.1)
Sodium: 138 mmol/L (ref 135–145)
TCO2: 21 mmol/L — ABNORMAL LOW (ref 22–32)

## 2018-06-29 LAB — POCT I-STAT 4, (NA,K, GLUC, HGB,HCT)
Glucose, Bld: 185 mg/dL — ABNORMAL HIGH (ref 70–99)
Glucose, Bld: 248 mg/dL — ABNORMAL HIGH (ref 70–99)
Glucose, Bld: 247 mg/dL — ABNORMAL HIGH (ref 70–99)
Glucose, Bld: 192 mg/dL — ABNORMAL HIGH (ref 70–99)
Glucose, Bld: 178 mg/dL — ABNORMAL HIGH (ref 70–99)
Glucose, Bld: 134 mg/dL — ABNORMAL HIGH (ref 70–99)
HCT: 37 % — ABNORMAL LOW (ref 39.0–52.0)
HCT: 37 % — ABNORMAL LOW (ref 39.0–52.0)
HCT: 31 % — ABNORMAL LOW (ref 39.0–52.0)
HCT: 31 % — ABNORMAL LOW (ref 39.0–52.0)
HCT: 30 % — ABNORMAL LOW (ref 39.0–52.0)
HCT: 28 % — ABNORMAL LOW (ref 39.0–52.0)
Hemoglobin: 12.6 g/dL — ABNORMAL LOW (ref 13.0–17.0)
Hemoglobin: 12.6 g/dL — ABNORMAL LOW (ref 13.0–17.0)
Hemoglobin: 10.5 g/dL — ABNORMAL LOW (ref 13.0–17.0)
Hemoglobin: 10.5 g/dL — ABNORMAL LOW (ref 13.0–17.0)
Hemoglobin: 10.2 g/dL — ABNORMAL LOW (ref 13.0–17.0)
Hemoglobin: 9.5 g/dL — ABNORMAL LOW (ref 13.0–17.0)
Potassium: 4.1 mmol/L (ref 3.5–5.1)
Potassium: 4.6 mmol/L (ref 3.5–5.1)
Potassium: 3.8 mmol/L (ref 3.5–5.1)
Potassium: 3.6 mmol/L (ref 3.5–5.1)
Potassium: 5 mmol/L (ref 3.5–5.1)
Potassium: 4.3 mmol/L (ref 3.5–5.1)
Sodium: 136 mmol/L (ref 135–145)
Sodium: 137 mmol/L (ref 135–145)
Sodium: 137 mmol/L (ref 135–145)
Sodium: 138 mmol/L (ref 135–145)
Sodium: 136 mmol/L (ref 135–145)
Sodium: 138 mmol/L (ref 135–145)

## 2018-06-29 LAB — BASIC METABOLIC PANEL
Anion gap: 13 (ref 5–15)
BUN: 24 mg/dL — ABNORMAL HIGH (ref 8–23)
CO2: 20 mmol/L — ABNORMAL LOW (ref 22–32)
Calcium: 9.4 mg/dL (ref 8.9–10.3)
Chloride: 105 mmol/L (ref 98–111)
Creatinine, Ser: 2.12 mg/dL — ABNORMAL HIGH (ref 0.61–1.24)
GFR calc Af Amer: 31 mL/min — ABNORMAL LOW (ref >60)
GFR calc non Af Amer: 27 mL/min — ABNORMAL LOW (ref >60)
Glucose, Bld: 117 mg/dL — ABNORMAL HIGH (ref 70–99)
Potassium: 4.9 mmol/L (ref 3.5–5.1)
Sodium: 138 mmol/L (ref 135–145)

## 2018-06-29 LAB — CBC
HCT: 43.5 % (ref 39.0–52.0)
HCT: 32.8 % — ABNORMAL LOW (ref 39.0–52.0)
Hemoglobin: 14.2 g/dL (ref 13.0–17.0)
Hemoglobin: 10.7 g/dL — ABNORMAL LOW (ref 13.0–17.0)
MCH: 29.8 pg (ref 26.0–34.0)
MCH: 29.7 pg (ref 26.0–34.0)
MCHC: 32.6 g/dL (ref 30.0–36.0)
MCHC: 32.6 g/dL (ref 30.0–36.0)
MCV: 91.4 fL (ref 80.0–100.0)
MCV: 91.1 fL (ref 80.0–100.0)
Platelets: 278 10*3/uL (ref 150–400)
Platelets: 222 10*3/uL (ref 150–400)
RBC: 4.76 MIL/uL (ref 4.22–5.81)
RBC: 3.6 MIL/uL — ABNORMAL LOW (ref 4.22–5.81)
RDW: 13.8 % (ref 11.5–15.5)
RDW: 13.8 % (ref 11.5–15.5)
WBC: 11.9 10*3/uL — ABNORMAL HIGH (ref 4.0–10.5)
WBC: 16.7 10*3/uL — ABNORMAL HIGH (ref 4.0–10.5)
nRBC: 0 % (ref 0.0–0.2)
nRBC: 0 % (ref 0.0–0.2)

## 2018-06-29 LAB — GLUCOSE, CAPILLARY
Glucose-Capillary: 91 mg/dL (ref 70–99)
Glucose-Capillary: 86 mg/dL (ref 70–99)
Glucose-Capillary: 92 mg/dL (ref 70–99)
Glucose-Capillary: 58 mg/dL — ABNORMAL LOW (ref 70–99)
Glucose-Capillary: 152 mg/dL — ABNORMAL HIGH (ref 70–99)
Glucose-Capillary: 96 mg/dL (ref 70–99)
Glucose-Capillary: 126 mg/dL — ABNORMAL HIGH (ref 70–99)
Glucose-Capillary: 131 mg/dL — ABNORMAL HIGH (ref 70–99)
Glucose-Capillary: 146 mg/dL — ABNORMAL HIGH (ref 70–99)

## 2018-06-29 LAB — CREATININE, SERUM
Creatinine, Ser: 1.65 mg/dL — ABNORMAL HIGH (ref 0.61–1.24)
GFR calc Af Amer: 42 mL/min — ABNORMAL LOW (ref >60)
GFR calc non Af Amer: 36 mL/min — ABNORMAL LOW (ref >60)

## 2018-06-29 LAB — PROTIME-INR
INR: 1.6 — ABNORMAL HIGH (ref 0.8–1.2)
Prothrombin Time: 18.7 seconds — ABNORMAL HIGH (ref 11.4–15.2)

## 2018-06-29 LAB — APTT: aPTT: 33 seconds (ref 24–36)

## 2018-06-29 LAB — ECHO INTRAOPERATIVE TEE
Height: 68 in
Weight: 3555.58 oz

## 2018-06-29 LAB — HEMOGLOBIN AND HEMATOCRIT, BLOOD
HCT: 32.2 % — ABNORMAL LOW (ref 39.0–52.0)
Hemoglobin: 10.6 g/dL — ABNORMAL LOW (ref 13.0–17.0)

## 2018-06-29 LAB — HEPARIN LEVEL (UNFRACTIONATED): Heparin Unfractionated: 0.86 IU/mL — ABNORMAL HIGH (ref 0.30–0.70)

## 2018-06-29 LAB — PLATELET COUNT: Platelets: 168 10*3/uL (ref 150–400)

## 2018-06-29 LAB — MAGNESIUM: Magnesium: 3 mg/dL — ABNORMAL HIGH (ref 1.7–2.4)

## 2018-06-29 SURGERY — CORONARY ARTERY BYPASS GRAFTING (CABG)
Anesthesia: General | Site: Chest

## 2018-06-29 MED ORDER — EPINEPHRINE 1 MG/10ML IJ SOSY
PREFILLED_SYRINGE | INTRAMUSCULAR | Status: DC | PRN
  Administered 2018-06-29: .1 mg via INTRAVENOUS
  Administered 2018-06-29: 0.2 mg via INTRAVENOUS
  Administered 2018-06-29: 0.1 mg via INTRAVENOUS

## 2018-06-29 MED ORDER — FENTANYL CITRATE (PF) 250 MCG/5ML IJ SOLN
INTRAMUSCULAR | Status: AC
  Filled 2018-06-29: qty 25

## 2018-06-29 MED ORDER — ACETAMINOPHEN 650 MG RE SUPP
650.0000 mg | Freq: Once | RECTAL | Status: AC
  Administered 2018-06-29: 16:00:00 650 mg via RECTAL

## 2018-06-29 MED ORDER — SODIUM CHLORIDE 0.9 % IV SOLN
INTRAVENOUS | Status: DC | PRN
  Administered 2018-06-29: 13:00:00 via INTRAVENOUS

## 2018-06-29 MED ORDER — ONDANSETRON HCL 4 MG/2ML IJ SOLN: 4.0000 mg | Freq: Four times a day (QID) | INTRAMUSCULAR | Status: DC | PRN

## 2018-06-29 MED ORDER — DEXMEDETOMIDINE HCL IN NACL 200 MCG/50ML IV SOLN
0.0000 ug/kg/h | INTRAVENOUS | Status: DC
  Administered 2018-06-29: 15:00:00 0.5 ug/kg/h via INTRAVENOUS

## 2018-06-29 MED ORDER — MAGNESIUM SULFATE 4 GM/100ML IV SOLN
4.0000 g | Freq: Once | INTRAVENOUS | Status: AC
  Administered 2018-06-29: 15:00:00 4 g via INTRAVENOUS
  Filled 2018-06-29: qty 100

## 2018-06-29 MED ORDER — POTASSIUM CHLORIDE 10 MEQ/50ML IV SOLN: 10.0000 meq | INTRAVENOUS | Status: AC

## 2018-06-29 MED ORDER — DOPAMINE-DEXTROSE 3.2-5 MG/ML-% IV SOLN
0.0000 ug/kg/min | INTRAVENOUS | Status: DC
  Administered 2018-07-01: 3 ug/kg/min via INTRAVENOUS
  Filled 2018-06-29: qty 250

## 2018-06-29 MED ORDER — HEPARIN SODIUM (PORCINE) 1000 UNIT/ML IJ SOLN
INTRAMUSCULAR | Status: DC | PRN
  Administered 2018-06-29: 32000 [IU] via INTRAVENOUS

## 2018-06-29 MED ORDER — ESMOLOL HCL 100 MG/10ML IV SOLN
INTRAVENOUS | Status: DC | PRN
  Administered 2018-06-29: 30 mg via INTRAVENOUS
  Administered 2018-06-29 (×2): 10 mg via INTRAVENOUS

## 2018-06-29 MED ORDER — INSULIN REGULAR BOLUS VIA INFUSION
0.0000 [IU] | Freq: Three times a day (TID) | INTRAVENOUS | Status: DC
  Filled 2018-06-29: qty 10

## 2018-06-29 MED ORDER — ORAL CARE MOUTH RINSE: 15.0000 mL | OROMUCOSAL | Status: DC

## 2018-06-29 MED ORDER — EPHEDRINE SULFATE-NACL 50-0.9 MG/10ML-% IV SOSY
PREFILLED_SYRINGE | INTRAVENOUS | Status: DC | PRN
  Administered 2018-06-29 (×3): 15 mg via INTRAVENOUS

## 2018-06-29 MED ORDER — SODIUM CHLORIDE 0.9 % IV SOLN
INTRAVENOUS | Status: DC
  Administered 2018-06-29: 15:00:00 via INTRAVENOUS

## 2018-06-29 MED ORDER — RACEPINEPHRINE HCL 2.25 % IN NEBU: 0.5000 mL | INHALATION_SOLUTION | Freq: Once | RESPIRATORY_TRACT | Status: DC

## 2018-06-29 MED ORDER — LIDOCAINE 2% (20 MG/ML) 5 ML SYRINGE
INTRAMUSCULAR | Status: AC
  Filled 2018-06-29: qty 5

## 2018-06-29 MED ORDER — METOPROLOL TARTRATE 12.5 MG HALF TABLET: 12.5000 mg | ORAL_TABLET | Freq: Two times a day (BID) | ORAL | Status: DC

## 2018-06-29 MED ORDER — ACETAMINOPHEN 500 MG PO TABS
1000.0000 mg | ORAL_TABLET | Freq: Four times a day (QID) | ORAL | Status: AC
  Administered 2018-06-29 – 2018-07-03 (×13): 1000 mg via ORAL
  Administered 2018-07-03: 17:00:00 500 mg via ORAL
  Administered 2018-07-03 – 2018-07-04 (×5): 1000 mg via ORAL
  Filled 2018-06-29 (×19): qty 2

## 2018-06-29 MED ORDER — SODIUM CHLORIDE 0.9% FLUSH
10.0000 mL | Freq: Two times a day (BID) | INTRAVENOUS | Status: DC
  Administered 2018-06-29 – 2018-06-30 (×3): 10 mL
  Administered 2018-07-01: 09:00:00 20 mL

## 2018-06-29 MED ORDER — METOPROLOL TARTRATE 5 MG/5ML IV SOLN: 2.5000 mg | INTRAVENOUS | Status: DC | PRN

## 2018-06-29 MED ORDER — OXYCODONE HCL 5 MG PO TABS: 5.0000 mg | ORAL_TABLET | ORAL | Status: DC | PRN

## 2018-06-29 MED ORDER — ORAL CARE MOUTH RINSE
15.0000 mL | Freq: Two times a day (BID) | OROMUCOSAL | Status: DC
  Administered 2018-06-29 – 2018-07-02 (×6): 15 mL via OROMUCOSAL

## 2018-06-29 MED ORDER — SODIUM CHLORIDE 0.9 % IV SOLN
INTRAVENOUS | Status: DC | PRN
  Administered 2018-06-29: 13:00:00 750 mg via INTRAVENOUS

## 2018-06-29 MED ORDER — ROPINIROLE HCL 1 MG PO TABS
2.0000 mg | ORAL_TABLET | Freq: Every day | ORAL | Status: DC
  Administered 2018-06-30 – 2018-07-06 (×7): 2 mg via ORAL
  Filled 2018-06-29 (×7): qty 2

## 2018-06-29 MED ORDER — SODIUM CHLORIDE 0.9% FLUSH
3.0000 mL | Freq: Two times a day (BID) | INTRAVENOUS | Status: DC
  Administered 2018-06-30 – 2018-07-02 (×4): 3 mL via INTRAVENOUS

## 2018-06-29 MED ORDER — INSULIN REGULAR(HUMAN) IN NACL 100-0.9 UT/100ML-% IV SOLN: INTRAVENOUS | Status: DC

## 2018-06-29 MED ORDER — ALBUMIN HUMAN 5 % IV SOLN
INTRAVENOUS | Status: DC | PRN
  Administered 2018-06-29: 13:00:00 via INTRAVENOUS

## 2018-06-29 MED ORDER — LACTATED RINGERS IV SOLN
INTRAVENOUS | Status: DC
  Administered 2018-06-29: 16:00:00 via INTRAVENOUS

## 2018-06-29 MED ORDER — TRAZODONE HCL 100 MG PO TABS
100.0000 mg | ORAL_TABLET | Freq: Every day | ORAL | Status: DC
  Administered 2018-06-29 – 2018-07-05 (×7): 100 mg via ORAL
  Filled 2018-06-29 (×8): qty 1

## 2018-06-29 MED ORDER — DEXTROSE 50 % IV SOLN
INTRAVENOUS | Status: AC
  Administered 2018-06-29: 17:00:00 50 mL
  Filled 2018-06-29: qty 50

## 2018-06-29 MED ORDER — HEPARIN SODIUM (PORCINE) 1000 UNIT/ML IJ SOLN
INTRAMUSCULAR | Status: AC
  Filled 2018-06-29: qty 1

## 2018-06-29 MED ORDER — CHLORHEXIDINE GLUCONATE 0.12 % MT SOLN
15.0000 mL | OROMUCOSAL | Status: AC
  Administered 2018-06-29: 15 mL via OROMUCOSAL

## 2018-06-29 MED ORDER — SODIUM CHLORIDE 0.9 % IV SOLN: 250.0000 mL | INTRAVENOUS | Status: DC

## 2018-06-29 MED ORDER — ADULT MULTIVITAMIN W/MINERALS CH
1.0000 | ORAL_TABLET | Freq: Every day | ORAL | Status: DC
  Administered 2018-06-30 – 2018-07-06 (×7): 1 via ORAL
  Filled 2018-06-29 (×7): qty 1

## 2018-06-29 MED ORDER — ARTIFICIAL TEARS OPHTHALMIC OINT
TOPICAL_OINTMENT | OPHTHALMIC | Status: AC
  Filled 2018-06-29: qty 3.5

## 2018-06-29 MED ORDER — ESMOLOL HCL 100 MG/10ML IV SOLN
INTRAVENOUS | Status: AC
  Filled 2018-06-29: qty 10

## 2018-06-29 MED ORDER — PROTAMINE SULFATE 10 MG/ML IV SOLN
INTRAVENOUS | Status: AC
  Filled 2018-06-29: qty 50

## 2018-06-29 MED ORDER — DEXMEDETOMIDINE HCL IN NACL 200 MCG/50ML IV SOLN
INTRAVENOUS | Status: AC
  Filled 2018-06-29: qty 50

## 2018-06-29 MED ORDER — HEMOSTATIC AGENTS (NO CHARGE) OPTIME
TOPICAL | Status: DC | PRN
  Administered 2018-06-29: 1 via TOPICAL

## 2018-06-29 MED ORDER — SODIUM CHLORIDE 0.9% FLUSH: 3.0000 mL | INTRAVENOUS | Status: DC | PRN

## 2018-06-29 MED ORDER — ACETAMINOPHEN 160 MG/5ML PO SOLN: 1000.0000 mg | Freq: Four times a day (QID) | ORAL | Status: AC

## 2018-06-29 MED ORDER — SODIUM CHLORIDE 0.9% FLUSH: 10.0000 mL | INTRAVENOUS | Status: DC | PRN

## 2018-06-29 MED ORDER — EPINEPHRINE 1 MG/10ML IJ SOSY
PREFILLED_SYRINGE | INTRAMUSCULAR | Status: AC
  Filled 2018-06-29: qty 20

## 2018-06-29 MED ORDER — SODIUM CHLORIDE 0.9 % IV SOLN
INTRAVENOUS | Status: DC | PRN
  Administered 2018-06-29: 50 ug/min via INTRAVENOUS

## 2018-06-29 MED ORDER — ROPINIROLE HCL 1 MG PO TABS
4.0000 mg | ORAL_TABLET | Freq: Every day | ORAL | Status: DC
  Administered 2018-06-29 – 2018-07-05 (×7): 4 mg via ORAL
  Filled 2018-06-29 (×7): qty 4

## 2018-06-29 MED ORDER — FAMOTIDINE IN NACL 20-0.9 MG/50ML-% IV SOLN
20.0000 mg | Freq: Two times a day (BID) | INTRAVENOUS | Status: AC
  Administered 2018-06-29: 15:00:00 20 mg via INTRAVENOUS
  Filled 2018-06-29: qty 50

## 2018-06-29 MED ORDER — PROTAMINE SULFATE 10 MG/ML IV SOLN
INTRAVENOUS | Status: DC | PRN
  Administered 2018-06-29: 200 mg via INTRAVENOUS
  Administered 2018-06-29 (×2): 50 mg via INTRAVENOUS

## 2018-06-29 MED ORDER — SODIUM CHLORIDE 0.9 % IV SOLN
1.5000 g | Freq: Two times a day (BID) | INTRAVENOUS | Status: DC
  Filled 2018-06-29 (×2): qty 1.5

## 2018-06-29 MED ORDER — ASPIRIN EC 325 MG PO TBEC
325.0000 mg | DELAYED_RELEASE_TABLET | Freq: Every day | ORAL | Status: DC
  Administered 2018-06-30 – 2018-07-06 (×5): 325 mg via ORAL
  Filled 2018-06-29 (×7): qty 1

## 2018-06-29 MED ORDER — SODIUM CHLORIDE 0.9 % IV SOLN
1.5000 g | INTRAVENOUS | Status: DC
  Administered 2018-06-29: 1.5 g via INTRAVENOUS
  Filled 2018-06-29 (×2): qty 1.5

## 2018-06-29 MED ORDER — EPHEDRINE 5 MG/ML INJ
INTRAVENOUS | Status: AC
  Filled 2018-06-29: qty 10

## 2018-06-29 MED ORDER — PHENYLEPHRINE 40 MCG/ML (10ML) SYRINGE FOR IV PUSH (FOR BLOOD PRESSURE SUPPORT)
PREFILLED_SYRINGE | INTRAVENOUS | Status: DC | PRN
  Administered 2018-06-29 (×3): 120 ug via INTRAVENOUS
  Administered 2018-06-29: 80 ug via INTRAVENOUS
  Administered 2018-06-29: 160 ug via INTRAVENOUS

## 2018-06-29 MED ORDER — PROPOFOL 10 MG/ML IV BOLUS
INTRAVENOUS | Status: DC | PRN
  Administered 2018-06-29: 110 mg via INTRAVENOUS

## 2018-06-29 MED ORDER — MIDAZOLAM HCL 5 MG/5ML IJ SOLN
INTRAMUSCULAR | Status: DC | PRN
  Administered 2018-06-29 (×2): 1 mg via INTRAVENOUS
  Administered 2018-06-29: 2 mg via INTRAVENOUS
  Administered 2018-06-29: 5 mg via INTRAVENOUS
  Administered 2018-06-29: 1 mg via INTRAVENOUS

## 2018-06-29 MED ORDER — LACTATED RINGERS IV SOLN
INTRAVENOUS | Status: DC | PRN
  Administered 2018-06-29 (×2): via INTRAVENOUS

## 2018-06-29 MED ORDER — ASPIRIN 81 MG PO CHEW
324.0000 mg | CHEWABLE_TABLET | Freq: Every day | ORAL | Status: DC
  Administered 2018-07-04 – 2018-07-05 (×2): 324 mg
  Filled 2018-06-29 (×2): qty 4

## 2018-06-29 MED ORDER — TRAMADOL HCL 50 MG PO TABS
50.0000 mg | ORAL_TABLET | Freq: Two times a day (BID) | ORAL | Status: DC | PRN
  Administered 2018-07-03: 50 mg via ORAL
  Filled 2018-06-29: qty 1

## 2018-06-29 MED ORDER — ARTIFICIAL TEARS OPHTHALMIC OINT
TOPICAL_OINTMENT | OPHTHALMIC | Status: DC | PRN
  Administered 2018-06-29: 1 via OPHTHALMIC

## 2018-06-29 MED ORDER — SUCCINYLCHOLINE CHLORIDE 200 MG/10ML IV SOSY
PREFILLED_SYRINGE | INTRAVENOUS | Status: AC
  Filled 2018-06-29: qty 10

## 2018-06-29 MED ORDER — LACTATED RINGERS IV SOLN
INTRAVENOUS | Status: DC | PRN
  Administered 2018-06-29: 07:00:00 via INTRAVENOUS

## 2018-06-29 MED ORDER — PHENYLEPHRINE 40 MCG/ML (10ML) SYRINGE FOR IV PUSH (FOR BLOOD PRESSURE SUPPORT)
PREFILLED_SYRINGE | INTRAVENOUS | Status: AC
  Filled 2018-06-29: qty 10

## 2018-06-29 MED ORDER — ROCURONIUM BROMIDE 10 MG/ML (PF) SYRINGE
PREFILLED_SYRINGE | INTRAVENOUS | Status: DC | PRN
  Administered 2018-06-29: 100 mg via INTRAVENOUS
  Administered 2018-06-29 (×2): 50 mg via INTRAVENOUS

## 2018-06-29 MED ORDER — MIDAZOLAM HCL 2 MG/2ML IJ SOLN: 2.0000 mg | INTRAMUSCULAR | Status: DC | PRN

## 2018-06-29 MED ORDER — CHLORHEXIDINE GLUCONATE CLOTH 2 % EX PADS
6.0000 | MEDICATED_PAD | Freq: Every day | CUTANEOUS | Status: DC
  Administered 2018-07-01: 6 via TOPICAL

## 2018-06-29 MED ORDER — EPINEPHRINE PF 1 MG/ML IJ SOLN
INTRAMUSCULAR | Status: DC | PRN
  Administered 2018-06-29 (×2): .3 mg via INTRAVENOUS
  Administered 2018-06-29: .1 mg via INTRAVENOUS
  Administered 2018-06-29: .4 mg via INTRAVENOUS
  Administered 2018-06-29: .2 mg via INTRAVENOUS

## 2018-06-29 MED ORDER — THROMBIN (RECOMBINANT) 20000 UNITS EX SOLR
CUTANEOUS | Status: AC
  Filled 2018-06-29: qty 20000

## 2018-06-29 MED ORDER — PANTOPRAZOLE SODIUM 40 MG PO TBEC
40.0000 mg | DELAYED_RELEASE_TABLET | Freq: Every day | ORAL | Status: DC
  Administered 2018-07-01 – 2018-07-06 (×6): 40 mg via ORAL
  Filled 2018-06-29 (×6): qty 1

## 2018-06-29 MED ORDER — THROMBIN 20000 UNITS EX SOLR
OROMUCOSAL | Status: DC | PRN
  Administered 2018-06-29 (×3): via TOPICAL

## 2018-06-29 MED ORDER — THROMBIN 20000 UNITS EX SOLR
CUTANEOUS | Status: DC | PRN
  Administered 2018-06-29: 20000 [IU] via TOPICAL

## 2018-06-29 MED ORDER — PROPOFOL 10 MG/ML IV BOLUS
INTRAVENOUS | Status: AC
  Filled 2018-06-29: qty 20

## 2018-06-29 MED ORDER — MIDAZOLAM HCL (PF) 10 MG/2ML IJ SOLN
INTRAMUSCULAR | Status: AC
  Filled 2018-06-29: qty 2

## 2018-06-29 MED ORDER — PHENYLEPHRINE HCL-NACL 20-0.9 MG/250ML-% IV SOLN
0.0000 ug/min | INTRAVENOUS | Status: DC
  Administered 2018-06-29: 50 ug/min via INTRAVENOUS
  Administered 2018-06-30 (×2): 70 ug/min via INTRAVENOUS
  Administered 2018-06-30: 30 ug/min via INTRAVENOUS
  Administered 2018-07-01: 14 ug/min via INTRAVENOUS
  Filled 2018-06-29 (×5): qty 250

## 2018-06-29 MED ORDER — SODIUM CHLORIDE 0.45 % IV SOLN
INTRAVENOUS | Status: DC | PRN
  Administered 2018-06-29: 15:00:00 via INTRAVENOUS

## 2018-06-29 MED ORDER — DOCUSATE SODIUM 100 MG PO CAPS
200.0000 mg | ORAL_CAPSULE | Freq: Every day | ORAL | Status: DC
  Administered 2018-06-30 – 2018-07-05 (×6): 200 mg via ORAL
  Filled 2018-06-29 (×6): qty 2

## 2018-06-29 MED ORDER — BISACODYL 5 MG PO TBEC
10.0000 mg | DELAYED_RELEASE_TABLET | Freq: Every day | ORAL | Status: DC
  Administered 2018-06-30 – 2018-07-05 (×5): 10 mg via ORAL
  Filled 2018-06-29 (×5): qty 2

## 2018-06-29 MED ORDER — ACETAMINOPHEN 160 MG/5ML PO SOLN: 650.0000 mg | Freq: Once | ORAL | Status: AC

## 2018-06-29 MED ORDER — ALBUMIN HUMAN 5 % IV SOLN
250.0000 mL | INTRAVENOUS | Status: AC | PRN
  Administered 2018-06-29 (×2): 12.5 g via INTRAVENOUS

## 2018-06-29 MED ORDER — METOPROLOL TARTRATE 25 MG/10 ML ORAL SUSPENSION: 12.5000 mg | Freq: Two times a day (BID) | ORAL | Status: DC

## 2018-06-29 MED ORDER — MORPHINE SULFATE (PF) 2 MG/ML IV SOLN
1.0000 mg | INTRAVENOUS | Status: DC | PRN
  Administered 2018-06-30: 2 mg via INTRAVENOUS
  Filled 2018-06-29: qty 1

## 2018-06-29 MED ORDER — LACTATED RINGERS IV SOLN
INTRAVENOUS | Status: DC | PRN
  Administered 2018-06-29 (×2): via INTRAVENOUS

## 2018-06-29 MED ORDER — BISACODYL 10 MG RE SUPP: 10.0000 mg | Freq: Every day | RECTAL | Status: DC

## 2018-06-29 MED ORDER — VANCOMYCIN HCL IN DEXTROSE 1-5 GM/200ML-% IV SOLN
1000.0000 mg | Freq: Once | INTRAVENOUS | Status: AC
  Administered 2018-06-29: 21:00:00 1000 mg via INTRAVENOUS
  Filled 2018-06-29: qty 200

## 2018-06-29 MED ORDER — LACTATED RINGERS IV SOLN: 500.0000 mL | Freq: Once | INTRAVENOUS | Status: DC | PRN

## 2018-06-29 MED ORDER — ROCURONIUM BROMIDE 10 MG/ML (PF) SYRINGE
PREFILLED_SYRINGE | INTRAVENOUS | Status: AC
  Filled 2018-06-29: qty 10

## 2018-06-29 MED ORDER — CHLORHEXIDINE GLUCONATE 0.12% ORAL RINSE (MEDLINE KIT)
15.0000 mL | Freq: Two times a day (BID) | OROMUCOSAL | Status: DC
  Administered 2018-06-29: 20:00:00 15 mL via OROMUCOSAL

## 2018-06-29 MED ORDER — NITROGLYCERIN IN D5W 200-5 MCG/ML-% IV SOLN: 0.0000 ug/min | INTRAVENOUS | Status: DC

## 2018-06-29 MED ORDER — 0.9 % SODIUM CHLORIDE (POUR BTL) OPTIME
TOPICAL | Status: DC | PRN
  Administered 2018-06-29: 6000 mL

## 2018-06-29 MED ORDER — MULTIVITAMINS PO CAPS: 1.0000 | ORAL_CAPSULE | Freq: Every day | ORAL | Status: DC

## 2018-06-29 MED ORDER — LACTATED RINGERS IV SOLN: INTRAVENOUS | Status: DC

## 2018-06-29 MED ORDER — FENTANYL CITRATE (PF) 250 MCG/5ML IJ SOLN
INTRAMUSCULAR | Status: DC | PRN
  Administered 2018-06-29: 25 ug via INTRAVENOUS
  Administered 2018-06-29: 100 ug via INTRAVENOUS
  Administered 2018-06-29: 200 ug via INTRAVENOUS
  Administered 2018-06-29: 25 ug via INTRAVENOUS
  Administered 2018-06-29: 750 ug via INTRAVENOUS

## 2018-06-29 MED FILL — Heparin Sodium (Porcine) Inj 1000 Unit/ML: INTRAMUSCULAR | Qty: 10 | Status: AC

## 2018-06-29 SURGICAL SUPPLY — 115 items
BAG DECANTER FOR FLEXI CONT (MISCELLANEOUS) ×4 IMPLANT
BANDAGE ACE 4X5 VEL STRL LF (GAUZE/BANDAGES/DRESSINGS) ×4 IMPLANT
BANDAGE ACE 6X5 VEL STRL LF (GAUZE/BANDAGES/DRESSINGS) ×4 IMPLANT
BANDAGE ELASTIC 4 VELCRO ST LF (GAUZE/BANDAGES/DRESSINGS) ×4 IMPLANT
BANDAGE ELASTIC 6 VELCRO ST LF (GAUZE/BANDAGES/DRESSINGS) ×4 IMPLANT
BASKET HEART  (ORDER IN 25'S) (MISCELLANEOUS) ×1
BASKET HEART (ORDER IN 25'S) (MISCELLANEOUS) ×1
BASKET HEART (ORDER IN 25S) (MISCELLANEOUS) ×2 IMPLANT
BLADE STERNUM SYSTEM 6 (BLADE) ×4 IMPLANT
BLADE SURG 11 STRL SS (BLADE) ×4 IMPLANT
BNDG GAUZE ELAST 4 BULKY (GAUZE/BANDAGES/DRESSINGS) ×4 IMPLANT
CANISTER SUCT 3000ML PPV (MISCELLANEOUS) ×4 IMPLANT
CANNULA ARTERIAL NVNT 3/8 22FR (MISCELLANEOUS) ×4 IMPLANT
CATH ROBINSON RED A/P 18FR (CATHETERS) ×8 IMPLANT
CATH THORACIC 28FR (CATHETERS) ×4 IMPLANT
CATH THORACIC 36FR (CATHETERS) ×4 IMPLANT
CATH THORACIC 36FR RT ANG (CATHETERS) ×4 IMPLANT
CLIP FOGARTY SPRING 6M (CLIP) ×4 IMPLANT
CLIP VESOCCLUDE MED 24/CT (CLIP) IMPLANT
CLIP VESOCCLUDE SM WIDE 24/CT (CLIP) IMPLANT
COVER PROBE W GEL 5X96 (DRAPES) ×4 IMPLANT
COVER WAND RF STERILE (DRAPES) IMPLANT
CRADLE DONUT ADULT HEAD (MISCELLANEOUS) ×4 IMPLANT
DERMABOND ADVANCED (GAUZE/BANDAGES/DRESSINGS) ×2
DERMABOND ADVANCED .7 DNX12 (GAUZE/BANDAGES/DRESSINGS) ×2 IMPLANT
DRAPE CARDIOVASCULAR INCISE (DRAPES) ×2
DRAPE SLUSH/WARMER DISC (DRAPES) ×4 IMPLANT
DRAPE SRG 135X102X78XABS (DRAPES) ×2 IMPLANT
DRSG COVADERM 4X14 (GAUZE/BANDAGES/DRESSINGS) ×4 IMPLANT
ELECT CAUTERY BLADE 6.4 (BLADE) ×4 IMPLANT
ELECT REM PT RETURN 9FT ADLT (ELECTROSURGICAL) ×8
ELECTRODE REM PT RTRN 9FT ADLT (ELECTROSURGICAL) ×4 IMPLANT
FELT TEFLON 1X6 (MISCELLANEOUS) ×4 IMPLANT
GAUZE SPONGE 4X4 12PLY STRL (GAUZE/BANDAGES/DRESSINGS) ×8 IMPLANT
GAUZE SPONGE 4X4 12PLY STRL LF (GAUZE/BANDAGES/DRESSINGS) ×8 IMPLANT
GLOVE BIO SURGEON STRL SZ 6 (GLOVE) IMPLANT
GLOVE BIO SURGEON STRL SZ 6.5 (GLOVE) ×18 IMPLANT
GLOVE BIO SURGEON STRL SZ7 (GLOVE) IMPLANT
GLOVE BIO SURGEON STRL SZ7.5 (GLOVE) IMPLANT
GLOVE BIO SURGEONS STRL SZ 6.5 (GLOVE) ×6
GLOVE BIOGEL PI IND STRL 6 (GLOVE) ×4 IMPLANT
GLOVE BIOGEL PI IND STRL 6.5 (GLOVE) ×6 IMPLANT
GLOVE BIOGEL PI IND STRL 7.0 (GLOVE) IMPLANT
GLOVE BIOGEL PI INDICATOR 6 (GLOVE) ×4
GLOVE BIOGEL PI INDICATOR 6.5 (GLOVE) ×6
GLOVE BIOGEL PI INDICATOR 7.0 (GLOVE)
GLOVE EUDERMIC 7 POWDERFREE (GLOVE) ×8 IMPLANT
GLOVE ORTHO TXT STRL SZ7.5 (GLOVE) IMPLANT
GOWN STRL REUS W/ TWL LRG LVL3 (GOWN DISPOSABLE) ×14 IMPLANT
GOWN STRL REUS W/ TWL XL LVL3 (GOWN DISPOSABLE) ×2 IMPLANT
GOWN STRL REUS W/TWL LRG LVL3 (GOWN DISPOSABLE) ×14
GOWN STRL REUS W/TWL XL LVL3 (GOWN DISPOSABLE) ×2
HEMOSTAT POWDER SURGIFOAM 1G (HEMOSTASIS) ×12 IMPLANT
HEMOSTAT SURGICEL 2X14 (HEMOSTASIS) ×4 IMPLANT
INSERT FOGARTY 61MM (MISCELLANEOUS) IMPLANT
INSERT FOGARTY XLG (MISCELLANEOUS) IMPLANT
KIT BASIN OR (CUSTOM PROCEDURE TRAY) ×4 IMPLANT
KIT CATH CPB BARTLE (MISCELLANEOUS) ×4 IMPLANT
KIT SUCTION CATH 14FR (SUCTIONS) ×4 IMPLANT
KIT TURNOVER KIT B (KITS) ×4 IMPLANT
KIT VASOVIEW HEMOPRO 2 VH 4000 (KITS) ×4 IMPLANT
NS IRRIG 1000ML POUR BTL (IV SOLUTION) ×24 IMPLANT
PACK E OPEN HEART (SUTURE) ×4 IMPLANT
PACK OPEN HEART (CUSTOM PROCEDURE TRAY) ×4 IMPLANT
PAD ARMBOARD 7.5X6 YLW CONV (MISCELLANEOUS) ×8 IMPLANT
PAD ELECT DEFIB RADIOL ZOLL (MISCELLANEOUS) ×4 IMPLANT
PENCIL BUTTON HOLSTER BLD 10FT (ELECTRODE) ×4 IMPLANT
PUNCH AORTIC ROTATE 4.0MM (MISCELLANEOUS) IMPLANT
PUNCH AORTIC ROTATE 4.5MM 8IN (MISCELLANEOUS) ×4 IMPLANT
PUNCH AORTIC ROTATE 5MM 8IN (MISCELLANEOUS) IMPLANT
SET CARDIOPLEGIA MPS 5001102 (MISCELLANEOUS) ×4 IMPLANT
SPONGE INTESTINAL PEANUT (DISPOSABLE) IMPLANT
SPONGE LAP 18X18 RF (DISPOSABLE) IMPLANT
SPONGE LAP 18X18 X RAY DECT (DISPOSABLE) ×12 IMPLANT
SPONGE LAP 4X18 RFD (DISPOSABLE) IMPLANT
SUT BONE WAX W31G (SUTURE) ×4 IMPLANT
SUT ETHIBOND 2 0 SH (SUTURE) ×2
SUT ETHIBOND 2 0 SH 36X2 (SUTURE) ×2 IMPLANT
SUT MNCRL AB 4-0 PS2 18 (SUTURE) ×8 IMPLANT
SUT PROLENE 3 0 SH DA (SUTURE) IMPLANT
SUT PROLENE 3 0 SH1 36 (SUTURE) ×4 IMPLANT
SUT PROLENE 4 0 RB 1 (SUTURE)
SUT PROLENE 4 0 SH DA (SUTURE) IMPLANT
SUT PROLENE 4-0 RB1 .5 CRCL 36 (SUTURE) IMPLANT
SUT PROLENE 5 0 C 1 36 (SUTURE) IMPLANT
SUT PROLENE 6 0 C 1 30 (SUTURE) ×16 IMPLANT
SUT PROLENE 7 0 BV 1 (SUTURE) ×8 IMPLANT
SUT PROLENE 7 0 BV1 MDA (SUTURE) ×12 IMPLANT
SUT PROLENE 8 0 BV175 6 (SUTURE) IMPLANT
SUT SILK  1 MH (SUTURE)
SUT SILK 1 MH (SUTURE) IMPLANT
SUT SILK 2 0 SH (SUTURE) IMPLANT
SUT STEEL STERNAL CCS#1 18IN (SUTURE) IMPLANT
SUT STEEL SZ 6 DBL 3X14 BALL (SUTURE) ×12 IMPLANT
SUT VIC AB 1 CTX 36 (SUTURE) ×4
SUT VIC AB 1 CTX36XBRD ANBCTR (SUTURE) ×4 IMPLANT
SUT VIC AB 2-0 CT1 27 (SUTURE) ×4
SUT VIC AB 2-0 CT1 TAPERPNT 27 (SUTURE) ×4 IMPLANT
SUT VIC AB 2-0 CTX 27 (SUTURE) IMPLANT
SUT VIC AB 3-0 SH 27 (SUTURE)
SUT VIC AB 3-0 SH 27X BRD (SUTURE) IMPLANT
SUT VIC AB 3-0 X1 27 (SUTURE) IMPLANT
SUT VICRYL 4-0 PS2 18IN ABS (SUTURE) IMPLANT
SYSTEM SAHARA CHEST DRAIN ATS (WOUND CARE) ×4 IMPLANT
TAPE CLOTH SURG 4X10 WHT LF (GAUZE/BANDAGES/DRESSINGS) ×8 IMPLANT
TAPE PAPER 2X10 WHT MICROPORE (GAUZE/BANDAGES/DRESSINGS) ×4 IMPLANT
TOWEL GREEN STERILE (TOWEL DISPOSABLE) ×4 IMPLANT
TOWEL GREEN STERILE FF (TOWEL DISPOSABLE) ×4 IMPLANT
TRAY CATH LUMEN 1 20CM STRL (SET/KITS/TRAYS/PACK) ×4 IMPLANT
TRAY FOLEY SLVR 16FR TEMP STAT (SET/KITS/TRAYS/PACK) ×4 IMPLANT
TUBE SUCT INTRACARD DLP 20F (MISCELLANEOUS) ×8 IMPLANT
TUBING ART PRESS 48 MALE/FEM (TUBING) ×12 IMPLANT
TUBING LAP HI FLOW INSUFFLATIO (TUBING) ×4 IMPLANT
UNDERPAD 30X30 (UNDERPADS AND DIAPERS) ×4 IMPLANT
WATER STERILE IRR 1000ML POUR (IV SOLUTION) ×8 IMPLANT

## 2018-06-29 NOTE — Anesthesia Procedure Notes (Signed)
Procedure Name: Intubation Date/Time: 06/29/2018 8:19 AM Performed by: Harden Mo, CRNA Pre-anesthesia Checklist: Patient identified, Emergency Drugs available, Suction available and Patient being monitored Patient Re-evaluated:Patient Re-evaluated prior to induction Oxygen Delivery Method: Circle System Utilized Preoxygenation: Pre-oxygenation with 100% oxygen Induction Type: IV induction Ventilation: Mask ventilation without difficulty and Oral airway inserted - appropriate to patient size Laryngoscope Size: Sabra Heck and 2 Grade View: Grade I Tube type: Oral Tube size: 8.0 mm Number of attempts: 1 Airway Equipment and Method: Stylet and Oral airway Placement Confirmation: ETT inserted through vocal cords under direct vision,  positive ETCO2 and breath sounds checked- equal and bilateral Secured at: 23 cm Tube secured with: Tape Dental Injury: Teeth and Oropharynx as per pre-operative assessment

## 2018-06-29 NOTE — Brief Op Note (Signed)
06/26/2018 - 06/29/2018  11:58 AM  PATIENT:  John Palmer  85 y.o. male  PRE-OPERATIVE DIAGNOSIS:  CAD  POST-OPERATIVE DIAGNOSIS:  CAD  PROCEDURE:  Transesophageal Echocardiogram (TEE),LEFT FEMORAL ARTERIAL LINE,  MEDIAN STERNOTOMY for CORONARY ARTERY BYPASS GRAFTING (CABG)TIMES 4 (SVG to LAD, SVG to DIAGONAL, SVG to OM, SVG to RCA) USING THE RIGHT GREATER SAPHENOUS VEIN, HARVESTED ENDOSCOPICALLY   SURGEON:  Surgeon(s) and Role:    Gaye Pollack, MD - Primary  PHYSICIAN ASSISTANT: Lars Pinks PA-C  ASSISTANTS: Ara Kussmaul RNFA  ANESTHESIA:   general  EBL: Per anesthesia and perfusion record  COUNTS:  YES  DICTATION: .Dragon Dictation  PLAN OF CARE: Admit to inpatient   PATIENT DISPOSITION:  ICU - intubated and hemodynamically stable.   Delay start of Pharmacological VTE agent (>24hrs) due to surgical blood loss or risk of bleeding: yes  BASELINE WEIGHT: 100.8 kg

## 2018-06-29 NOTE — Op Note (Signed)
CARDIOVASCULAR SURGERY OPERATIVE NOTE  06/29/2018  Surgeon:  Gaye Pollack, MD  First Assistant: Lars Pinks,  PA-C   Preoperative Diagnosis:  Severe multi-vessel coronary artery disease   Postoperative Diagnosis:  Same   Procedure:  1. Median Sternotomy 2. Extracorporeal circulation 3.   Coronary artery bypass grafting x 4   Saphenous vein graft to the LAD  Saphenous vein graft to diagonal  Saphenous vein graft to OM  Saphenous vein graft to PDA 4.   Endoscopic vein harvest from the rignt leg 5.   Insertion of left femoral arterial line.  Anesthesia:  General Endotracheal   Clinical History/Surgical Indication:  This 85 year old active and independent gentleman who is in overall fairly good health has severe multivessel coronary disease presenting with a non-ST segment elevation MI.  His coronary stenoses are not amenable to PCI and therefore coronary bypass surgery is the only treatment option other than medical therapy and palliative care.  His coronary arteries appear suitable for coronary bypass surgery and his left ventricular function is normal.  He does have Stage III chronic kidney disease with a creatinine of 1.63 on admission.  Apparently it was 1.7 about 6 months ago.  I think his operative risk is increased due to his age and chronic kidney disease but the operative risk is still relatively low compared to his risk of no revascularization. I discussed the operative procedure with the patient and his son by telephone including alternatives, benefits and risks; including but not limited to bleeding, blood transfusion, infection, stroke, myocardial infarction, graft failure, heart block requiring a permanent pacemaker, organ dysfunction, and death.  John Palmer understands and agrees to proceed.   Preparation:  The patient was seen in the preoperative holding  area and the correct patient, correct operation were confirmed with the patient after reviewing the medical record and catheterization. The consent was signed by me. Preoperative antibiotics were given. A pulmonary arterial line and radial arterial line were placed by the anesthesia team. The patient was taken back to the operating room and positioned supine on the operating room table.  At the time he was positioned on the operating room table the radial arterial line lost its tracing.  He was placed under general endotracheal anesthesia by the anesthesia team and a foley catheter was placed.  After anesthetic induction the patient became hypotensive and bradycardic with ischemic changes on the EKG tracing on the monitor.  A right brachial arterial line was being placed by Dr. Conrad [place] at this time.  The patient was given an epinephrine bolus which resulted in marked hypertension and a marked rise in PA pressures.  This gradually decreased.  The neck, chest, abdomen, and both legs were prepped with betadine soap and solution and draped in the usual sterile manner. A surgical time-out was taken and the correct patient and operative procedure were confirmed with the nursing and anesthesia staff.  As we were doing our timeout the patient again developed bradycardia and hypotension and I felt that he was most likely ischemic from his tight dominant right coronary circulation.  TEE: performed by Dr. Lillia Abed.  This showed a left ventricular ejection fraction of 35 to 40% with inferior wall hypokinesis.  The aortic valve was trileaflet.  There is no stenosis.  There was mild to moderate central regurgitation.  The mitral valve appears structurally normal with mild to moderate regurgitation with a centrally directed jet.   Insertion of left femoral arterial line:  Since the right brachial arterial  line tracing was damped and unpredictable I decided to insert a left femoral arterial line for blood pressure  monitoring.  The left common femoral artery was located using ultrasound guidance and under direct vision a needle was inserted percutaneously into the artery.  There was excellent blood return.  A guidewire followed by a dilator was inserted and then a long femoral arterial catheter was inserted over the guidewire using Seldinger technique.  There was a good blood pressure tracing.  Catheter was fixed to the skin with silk sutures.   Cardiopulmonary Bypass:  A median sternotomy was performed. The pericardium was opened in the midline. Right ventricular function appeared very sluggish consistent with right coronary artery ischemia.  The ascending aorta was of normal size and had no palpable plaque. There were no contraindications to aortic cannulation or cross-clamping. The patient was fully systemically heparinized and the ACT was maintained > 400 sec. The proximal aortic arch was cannulated with a 36 F aortic cannula for arterial inflow. Venous cannulation was performed via the right atrial appendage using a two-staged venous cannula. An antegrade cardioplegia/vent cannula was inserted into the mid-ascending aorta.  The patient was placed on cardiopulmonary bypass urgently due to ongoing ischemia with hypotension and bradycardia.  Given his age and ongoing instability with ischemia I decided not to harvest the left internal mammary artery and use saphenous vein for all the bypasses.  The saphenous vein was harvested as noted below while on cardiopulmonary bypass.    Aortic occlusion was performed with a single cross-clamp. Systemic cooling to 32 degrees Centigrade and topical cooling of the heart with iced saline were used. Hyperkalemic antegrade cold blood cardioplegia was used to induce diastolic arrest and was then given at about 20 minute intervals into the aortic root and down the bypass grafts throughout the period of arrest to maintain myocardial temperature at or below 10 degrees centigrade. A  temperature probe was inserted into the interventricular septum and an insulating pad was placed in the pericardium.   Endoscopic vein harvest:  The right greater saphenous vein was harvested endoscopically through a 2 cm incision medial to the right knee. It was harvested from the upper thigh to below the knee. It was a large-sized vein of good quality. The side branches were all ligated with 4-0 silk ties.    Coronary arteries:  The coronary arteries were examined.   LAD: The LAD was a medium sized vessel with no distal disease beyond the mid vessel occlusion.  The diagonal branch was a medium sized vessel with no distal disease.  LCX: The obtuse marginal was a medium caliber vessel with no distal disease.  RCA: The RCA was diffusely diseased throughout the proximal and mid portions and this extended to the takeoff of the PDA branch.  The PDA and PL branch themselves had no visible disease and were large vessels.   Grafts: 1. SVG to the LAD: 1.75 mm. It was sewn end to side using 7-0 prolene continuous suture. 2. SVG to diagonal:  1.75 mm. It was sewn end to side using 7-0 prolene continuous suture. 3. SVG to OM:  1.75 mm. It was sewn end to side using 7-0 prolene continuous suture. 4. SVG to PDA:  2.0 mm. It was sewn end to side using 7-0 prolene continuous suture.  The proximal vein graft anastomoses were performed to the mid-ascending aorta using continuous 6-0 prolene suture. Graft markers were placed around the proximal anastomoses.   Completion:  The patient was rewarmed to  37 degrees Centigrade.  The crossclamp was removed with a time of 94 minutes. There was spontaneous return of sinus rhythm. The distal and proximal anastomoses were checked for hemostasis. The position of the grafts was satisfactory. Two temporary epicardial pacing wires were placed on the right atrium and two on the right ventricle. The patient was weaned from CPB without difficulty on dopamine 2 mcg which  was given throughout the bypass run for renal perfusion.  CPB time was 201 minutes. Cardiac output was 4.5 LPM.  TEE showed improved wall motion in the anterior and lateral regions.  There was still inferior wall hypokinesis.  Mild to moderate aortic insufficiency persisted.  There was mild mitral regurgitation.  Heparin was fully reversed with protamine and the aortic and venous cannulas removed. Hemostasis was achieved. Mediastinal drainage tubes were placed. The sternum was closed with double #6 stainless steel wires. The fascia was closed with continuous # 1 vicryl suture. The subcutaneous tissue was closed with 2-0 vicryl continuous suture. The skin was closed with 3-0 vicryl subcuticular suture. All sponge, needle, and instrument counts were reported correct at the end of the case. Dry sterile dressings were placed over the incisions and around the chest tubes which were connected to pleurevac suction. The patient was then transported to the surgical intensive care unit in stable condition.

## 2018-06-29 NOTE — Anesthesia Procedure Notes (Addendum)
Arterial Line Insertion Start/End6/01/2018 7:00 AM, 06/29/2018 7:10 AM Performed by: Murvin Natal, MD, anesthesiologist  Patient location: Pre-op. Preanesthetic checklist: patient identified, IV checked, risks and benefits discussed, surgical consent, monitors and equipment checked, pre-op evaluation, timeout performed and anesthesia consent Lidocaine 1% used for infiltration Left, radial was placed Catheter size: 20 G Hand hygiene performed  and Seldinger technique used  Attempts: 1 (Previous attempts by CRNA's) Procedure performed without using ultrasound guided technique. Following insertion, dressing applied and Biopatch. Post procedure assessment: normal  Patient tolerated the procedure well with no immediate complications.

## 2018-06-29 NOTE — Procedures (Signed)
Extubation Procedure Note  Patient Details:   Name: John Palmer DOB: [birth year] MRN: [id number]   Airway Documentation:    Vent end date: 06/29/18 Vent end time: 2059   Evaluation  O2 sats: stable throughout Complications: No apparent complications Patient did tolerate procedure well. Bilateral Breath Sounds: Clear, Diminished   Yes pt able to speak, no stridor post extubation, cuff leak not present MD notified and instructed to still pull ETT. Pt stable and doing well on 4 Lpm  with bubble humidifier.   Roby Lofts Analisa Sledd 06/29/2018, 9:03 PM

## 2018-06-29 NOTE — Anesthesia Procedure Notes (Signed)
Arterial Line Insertion Start/End6/01/2018 8:30 AM, 06/29/2018 8:40 AM Performed by: Lillia Abed, MD, anesthesiologist  Patient location: OR. Preanesthetic checklist: patient identified, IV checked, site marked, risks and benefits discussed, surgical consent, monitors and equipment checked, pre-op evaluation, timeout performed and anesthesia consent Right, brachial was placed Catheter size: 20 Fr Hand hygiene performed , maximum sterile barriers used  and Seldinger technique used  Attempts: 2 Procedure performed without using ultrasound guided technique. Following insertion, dressing applied and line sutured. Post procedure assessment: normal and unchanged  Patient tolerated the procedure well with no immediate complications. Additional procedure comments: Performed by KO in OR.

## 2018-06-29 NOTE — Progress Notes (Signed)
      [address]       [place] [address]             [phone]      S/p CABG x 4  Intubated but starting to wake up and follow commands  BP (!) 134/99   Pulse (!) 101   Temp 99.1 F (37.3 C)   Resp 16   Ht 5\' 8"  (1.727 m)   Wt 100.8 kg   SpO2 98%   BMI 33.79 kg/m   CI= 2.1 on dopamine 2 mcg/kg/min  Intake/Output Summary (Last 24 hours) at 06/29/2018 2004 Last data filed at 06/29/2018 1854 Gross per 24 hour  Intake 4310.12 ml  Output 3960 ml  Net 350.12 ml   Doing well early postop  Remo Lipps C. Roxan Hockey, MD [facility] [phone]

## 2018-06-29 NOTE — Anesthesia Preprocedure Evaluation (Addendum)
Anesthesia Evaluation  Patient identified by MRN, date of birth, ID band Patient awake    Reviewed: Allergy & Precautions, NPO status , Patient's Chart, lab work & pertinent test results  Airway Mallampati: II  TM Distance: >3 FB Neck ROM: Full    Dental  (+) Missing, Dental Advisory Given   Pulmonary neg pulmonary ROS,    Pulmonary exam normal breath sounds clear to auscultation       Cardiovascular + CAD  Normal cardiovascular exam Rhythm:Regular Rate:Normal  ECG: SR, rate 79  CATH: Severe, calcified, coronary artery disease with subtotally occluded ostial to proximal RCA within a "[place]".  RCA fills by left-to-right collaterals and also antegrade. 50% left main. Proximal to mid 90% LAD.  The significant diagonal contains 80% stenosis.  The LAD beyond the diagonal is totally occluded and fills by left to left collaterals. 2 obtuse marginals arise from the circumflex and are the sources of collaterals to the right coronary and LAD. Elevated LVEDP, 25 mmHg.  ECHO: 1. The left ventricle has normal systolic function with an ejection fraction of 60-65%. The cavity size was normal. Left ventricular diastolic Doppler parameters are consistent with pseudonormalization. 2. The right ventricle has low normal systolic function. The cavity was mildly enlarged. There is no increase in right ventricular wall thickness. 3. Left atrial size was mildly dilated. 4. Right atrial size was mildly dilated. 5. Mitral valve regurgitation is mild to moderate by color flow Doppler. 6. Moderate thickening of the aortic valve. Moderate calcification of the aortic valve. Aortic valve regurgitation is mild by color flow Doppler. Moderate aortic annular calcification noted. 7. The tricuspid valve is not well visualized. Tricuspid valve regurgitation was not assessed by color flow Doppler. 8. The aortic root and ascending aorta are normal in size and  structure. 9. The interatrial septum was not assessed.     Neuro/Psych negative neurological ROS  negative psych ROS   GI/Hepatic Neg liver ROS, GERD  Medicated and Controlled,  Endo/Other  negative endocrine ROS  Renal/GU Renal InsufficiencyRenal disease     Musculoskeletal negative musculoskeletal ROS (+)   Abdominal (+) + obese,   Peds  Hematology HLD   Anesthesia Other Findings CAD  Reproductive/Obstetrics                          Anesthesia Physical Anesthesia Plan  ASA: III  Anesthesia Plan: General   Post-op Pain Management:    Induction: Intravenous  PONV Risk Score and Plan: 2 and Treatment may vary due to age or medical condition  Airway Management Planned: Oral ETT  Additional Equipment: Arterial line, CVP, PA Cath, Ultrasound Guidance Line Placement and TEE  Intra-op Plan:   Post-operative Plan: Post-operative intubation/ventilation  Informed Consent: I have reviewed the patients History and Physical, chart, labs and discussed the procedure including the risks, benefits and alternatives for the proposed anesthesia with the patient or authorized representative who has indicated his/her understanding and acceptance.     Dental advisory given  Plan Discussed with: CRNA and Surgeon  Anesthesia Plan Comments:        Anesthesia Quick Evaluation

## 2018-06-29 NOTE — Progress Notes (Signed)
MD paged regarding pt lack of cuff leak, all other parameters satisfactory. MD stated to go ahead and extubate and can give racemic epi if needed. Will continue to monitor pt resp. status with RT.  Sherlie Ban, RN

## 2018-06-29 NOTE — Anesthesia Postprocedure Evaluation (Signed)
Anesthesia Post Note  Patient: John Palmer  Procedure(s) Performed: CORONARY ARTERY BYPASS GRAFTING (CABG)TIMES FOUR USING THE RIGHT GREATER SAPHENOUS VEIN, HARVESTED ENDOSCOPICALLY (N/A Chest) Transesophageal Echocardiogram (Tee) (N/A )     Patient location during evaluation: SICU Anesthesia Type: General Level of consciousness: sedated Pain management: pain level controlled Vital Signs Assessment: post-procedure vital signs reviewed and stable Respiratory status: patient remains intubated per anesthesia plan Cardiovascular status: stable Postop Assessment: no apparent nausea or vomiting Anesthetic complications: no    Last Vitals:  Vitals:   06/29/18 1600 06/29/18 1615  BP: 117/65 116/64  Pulse: 100 (!) 103  Resp: 18 16  Temp: 36.8 C 36.9 C  SpO2: 99% 99%    Last Pain:  Vitals:   06/29/18 1600  TempSrc: Core  PainSc:                  Kelon Easom DAVID

## 2018-06-29 NOTE — Anesthesia Procedure Notes (Signed)
Central Venous Catheter Insertion Performed by: Lillia Abed, MD, anesthesiologist Start/End6/01/2018 7:25 AM, 06/29/2018 7:35 AM Patient location: Pre-op. Preanesthetic checklist: patient identified, IV checked, risks and benefits discussed, surgical consent, monitors and equipment checked, pre-op evaluation, timeout performed and anesthesia consent Position: Trendelenburg Lidocaine 1% used for infiltration and patient sedated Hand hygiene performed  and maximum sterile barriers used  Catheter size: 8.5 Fr PA cath and Central line was placed.Sheath introducer Swan type:thermodilution Procedure performed using ultrasound guided technique. Ultrasound Notes:anatomy identified, needle tip was noted to be adjacent to the nerve/plexus identified, no ultrasound evidence of intravascular and/or intraneural injection and image(s) printed for medical record Attempts: 1 Following insertion, line sutured, dressing applied and Biopatch. Post procedure assessment: blood return through all ports, free fluid flow and no air  Patient tolerated the procedure well with no immediate complications.

## 2018-06-29 NOTE — Transfer of Care (Signed)
Immediate Anesthesia Transfer of Care Note  Patient: John Palmer  Procedure(s) Performed: CORONARY ARTERY BYPASS GRAFTING (CABG)TIMES FOUR USING THE RIGHT GREATER SAPHENOUS VEIN, HARVESTED ENDOSCOPICALLY (N/A Chest) Transesophageal Echocardiogram Darden Dates) (N/A )  Patient Location: SICU  Anesthesia Type:General  Level of Consciousness: sedated, unresponsive and Patient remains intubated per anesthesia plan  Airway & Oxygen Therapy: Patient remains intubated per anesthesia plan and Patient placed on Ventilator (see vital sign flow sheet for setting)  Post-op Assessment: Report given to RN and Post -op Vital signs reviewed and stable  Post vital signs: Reviewed and stable  Last Vitals:  Vitals Value Taken Time  BP 116/71 06/29/2018  2:27 PM  Temp    Pulse 110 06/29/2018  2:27 PM  Resp 21 06/29/2018  2:27 PM  SpO2 92 % 06/29/2018  2:27 PM    Last Pain:  Vitals:   06/29/18 0509  TempSrc: Oral  PainSc:       Patients Stated Pain Goal: 0 (27/51/70 0174)  Complications: No apparent anesthesia complications

## 2018-06-29 NOTE — OR Nursing (Signed)
1300 first call made to sicu, 1326 second call made to sicu.  Spoke with [employer]

## 2018-06-29 NOTE — Progress Notes (Signed)
RT assessed and has begun the final phase for extubation. Pt NIF -30, VC .9 L with good effort each attempt. Pt did not have cuff leak. RN contacting MD to notify and address extubation or not. RT will continue to monitor.

## 2018-06-29 NOTE — Progress Notes (Signed)
Late RT note: RT reassessed pt for phase two of heart wean. Pt able to hold head off of bed 20 plus seconds, follows commands when instructed to do so. Pt tolerating well. RN will pull ABG for final extubation steps, NIF and VC. Pt was placed on CPAP/PSV 10/5 FIO2 40%. RT will continue to monitor.

## 2018-06-29 NOTE — Progress Notes (Addendum)
Per Dr. Roxan Hockey, can remove femoral A-line. Will remove after AM labs, as brachial A-line unable to draw blood back and is inaccurate compared to femoral a-line and cuff pressures.  Sherlie Ban, RN

## 2018-06-29 NOTE — Progress Notes (Signed)
Pt declined CPAP for the night when offered by RT. RT will continue to monitor.

## 2018-06-30 ENCOUNTER — Encounter (HOSPITAL_COMMUNITY): Payer: Self-pay | Admitting: Surgery

## 2018-06-30 ENCOUNTER — Inpatient Hospital Stay (HOSPITAL_COMMUNITY): Payer: Medicare Other

## 2018-06-30 DIAGNOSIS — I200 Unstable angina: Secondary | ICD-10-CM

## 2018-06-30 LAB — CREATININE, SERUM
Creatinine, Ser: 1.93 mg/dL — ABNORMAL HIGH (ref 0.61–1.24)
GFR calc Af Amer: 35 mL/min — ABNORMAL LOW (ref >60)
GFR calc non Af Amer: 30 mL/min — ABNORMAL LOW (ref >60)

## 2018-06-30 LAB — POCT I-STAT, CHEM 8
BUN: 22 mg/dL (ref 8–23)
Calcium, Ion: 1.1 mmol/L — ABNORMAL LOW (ref 1.15–1.40)
Chloride: 103 mmol/L (ref 98–111)
Creatinine, Ser: 1.9 mg/dL — ABNORMAL HIGH (ref 0.61–1.24)
Glucose, Bld: 213 mg/dL — ABNORMAL HIGH (ref 70–99)
HCT: 24 % — ABNORMAL LOW (ref 39.0–52.0)
Hemoglobin: 8.2 g/dL — ABNORMAL LOW (ref 13.0–17.0)
Potassium: 4.3 mmol/L (ref 3.5–5.1)
Sodium: 136 mmol/L (ref 135–145)
TCO2: 21 mmol/L — ABNORMAL LOW (ref 22–32)

## 2018-06-30 LAB — GLUCOSE, CAPILLARY
Glucose-Capillary: 100 mg/dL — ABNORMAL HIGH (ref 70–99)
Glucose-Capillary: 116 mg/dL — ABNORMAL HIGH (ref 70–99)
Glucose-Capillary: 119 mg/dL — ABNORMAL HIGH (ref 70–99)
Glucose-Capillary: 120 mg/dL — ABNORMAL HIGH (ref 70–99)
Glucose-Capillary: 121 mg/dL — ABNORMAL HIGH (ref 70–99)
Glucose-Capillary: 136 mg/dL — ABNORMAL HIGH (ref 70–99)
Glucose-Capillary: 114 mg/dL — ABNORMAL HIGH (ref 70–99)
Glucose-Capillary: 121 mg/dL — ABNORMAL HIGH (ref 70–99)
Glucose-Capillary: 114 mg/dL — ABNORMAL HIGH (ref 70–99)
Glucose-Capillary: 141 mg/dL — ABNORMAL HIGH (ref 70–99)
Glucose-Capillary: 159 mg/dL — ABNORMAL HIGH (ref 70–99)

## 2018-06-30 LAB — POCT I-STAT 4, (NA,K, GLUC, HGB,HCT)
Glucose, Bld: 96 mg/dL (ref 70–99)
HCT: 35 % — ABNORMAL LOW (ref 39.0–52.0)
Hemoglobin: 11.9 g/dL — ABNORMAL LOW (ref 13.0–17.0)
Potassium: 4 mmol/L (ref 3.5–5.1)
Sodium: 139 mmol/L (ref 135–145)

## 2018-06-30 LAB — CBC
HCT: 36.8 % — ABNORMAL LOW (ref 39.0–52.0)
HCT: 30.3 % — ABNORMAL LOW (ref 39.0–52.0)
HCT: 28.8 % — ABNORMAL LOW (ref 39.0–52.0)
HCT: 26.2 % — ABNORMAL LOW (ref 39.0–52.0)
Hemoglobin: 12 g/dL — ABNORMAL LOW (ref 13.0–17.0)
Hemoglobin: 9.9 g/dL — ABNORMAL LOW (ref 13.0–17.0)
Hemoglobin: 9.1 g/dL — ABNORMAL LOW (ref 13.0–17.0)
Hemoglobin: 8.1 g/dL — ABNORMAL LOW (ref 13.0–17.0)
MCH: 29.7 pg (ref 26.0–34.0)
MCH: 29.9 pg (ref 26.0–34.0)
MCH: 29.4 pg (ref 26.0–34.0)
MCH: 29 pg (ref 26.0–34.0)
MCHC: 32.6 g/dL (ref 30.0–36.0)
MCHC: 32.7 g/dL (ref 30.0–36.0)
MCHC: 31.6 g/dL (ref 30.0–36.0)
MCHC: 30.9 g/dL (ref 30.0–36.0)
MCV: 91.1 fL (ref 80.0–100.0)
MCV: 91.5 fL (ref 80.0–100.0)
MCV: 93.2 fL (ref 80.0–100.0)
MCV: 93.9 fL (ref 80.0–100.0)
Platelets: 214 10*3/uL (ref 150–400)
Platelets: 211 10*3/uL (ref 150–400)
Platelets: 203 10*3/uL (ref 150–400)
Platelets: 178 10*3/uL (ref 150–400)
RBC: 4.04 MIL/uL — ABNORMAL LOW (ref 4.22–5.81)
RBC: 3.31 MIL/uL — ABNORMAL LOW (ref 4.22–5.81)
RBC: 3.09 MIL/uL — ABNORMAL LOW (ref 4.22–5.81)
RBC: 2.79 MIL/uL — ABNORMAL LOW (ref 4.22–5.81)
RDW: 13.6 % (ref 11.5–15.5)
RDW: 13.9 % (ref 11.5–15.5)
RDW: 14.2 % (ref 11.5–15.5)
RDW: 14.3 % (ref 11.5–15.5)
WBC: 22.3 10*3/uL — ABNORMAL HIGH (ref 4.0–10.5)
WBC: 13.8 10*3/uL — ABNORMAL HIGH (ref 4.0–10.5)
WBC: 13.4 10*3/uL — ABNORMAL HIGH (ref 4.0–10.5)
WBC: 11.8 10*3/uL — ABNORMAL HIGH (ref 4.0–10.5)
nRBC: 0 % (ref 0.0–0.2)
nRBC: 0 % (ref 0.0–0.2)
nRBC: 0 % (ref 0.0–0.2)
nRBC: 0 % (ref 0.0–0.2)

## 2018-06-30 LAB — BASIC METABOLIC PANEL
Anion gap: 8 (ref 5–15)
Anion gap: 11 (ref 5–15)
BUN: 19 mg/dL (ref 8–23)
BUN: 22 mg/dL (ref 8–23)
CO2: 21 mmol/L — ABNORMAL LOW (ref 22–32)
CO2: 20 mmol/L — ABNORMAL LOW (ref 22–32)
Calcium: 7.4 mg/dL — ABNORMAL LOW (ref 8.9–10.3)
Calcium: 7.6 mg/dL — ABNORMAL LOW (ref 8.9–10.3)
Chloride: 110 mmol/L (ref 98–111)
Chloride: 105 mmol/L (ref 98–111)
Creatinine, Ser: 1.73 mg/dL — ABNORMAL HIGH (ref 0.61–1.24)
Creatinine, Ser: 2 mg/dL — ABNORMAL HIGH (ref 0.61–1.24)
GFR calc Af Amer: 39 mL/min — ABNORMAL LOW (ref >60)
GFR calc Af Amer: 33 mL/min — ABNORMAL LOW (ref >60)
GFR calc non Af Amer: 34 mL/min — ABNORMAL LOW (ref >60)
GFR calc non Af Amer: 29 mL/min — ABNORMAL LOW (ref >60)
Glucose, Bld: 124 mg/dL — ABNORMAL HIGH (ref 70–99)
Glucose, Bld: 215 mg/dL — ABNORMAL HIGH (ref 70–99)
Potassium: 4.3 mmol/L (ref 3.5–5.1)
Potassium: 4.3 mmol/L (ref 3.5–5.1)
Sodium: 139 mmol/L (ref 135–145)
Sodium: 136 mmol/L (ref 135–145)

## 2018-06-30 LAB — MAGNESIUM
Magnesium: 2.7 mg/dL — ABNORMAL HIGH (ref 1.7–2.4)
Magnesium: 2.4 mg/dL (ref 1.7–2.4)

## 2018-06-30 MED ORDER — SODIUM CHLORIDE 0.9 % IV SOLN
1.5000 g | INTRAVENOUS | Status: AC
  Administered 2018-06-30: 1.5 g via INTRAVENOUS
  Filled 2018-06-30: qty 1.5

## 2018-06-30 MED ORDER — ENOXAPARIN SODIUM 30 MG/0.3ML ~~LOC~~ SOLN
30.0000 mg | Freq: Every day | SUBCUTANEOUS | Status: DC
  Administered 2018-06-30 – 2018-07-05 (×6): 30 mg via SUBCUTANEOUS
  Filled 2018-06-30 (×6): qty 0.3

## 2018-06-30 MED ORDER — INSULIN ASPART 100 UNIT/ML ~~LOC~~ SOLN
0.0000 [IU] | Freq: Three times a day (TID) | SUBCUTANEOUS | Status: DC
  Administered 2018-06-30: 17:00:00 2 [IU] via SUBCUTANEOUS
  Administered 2018-07-01: 12:00:00 4 [IU] via SUBCUTANEOUS
  Administered 2018-07-01: 2 [IU] via SUBCUTANEOUS

## 2018-06-30 MED ORDER — ENOXAPARIN SODIUM 40 MG/0.4ML ~~LOC~~ SOLN: 40.0000 mg | Freq: Every day | SUBCUTANEOUS | Status: DC

## 2018-06-30 MED ORDER — INSULIN ASPART 100 UNIT/ML ~~LOC~~ SOLN: 0.0000 [IU] | SUBCUTANEOUS | Status: DC

## 2018-06-30 MED FILL — Thrombin (Recombinant) For Soln 20000 Unit: CUTANEOUS | Qty: 1 | Status: AC

## 2018-06-30 NOTE — Progress Notes (Signed)
Femoral arterial line removed at 0438. Documentation not present in LDAs. No bleeding from site. Will continue to monitor.  Sherlie Ban, RN

## 2018-06-30 NOTE — Progress Notes (Signed)
Late RT note: Pt VC .9 L, NIF -30. Pt had good effort with each attempt. RT will continue to monitor.

## 2018-06-30 NOTE — Progress Notes (Signed)
CT surgery p.m. Rounds  Patient sitting up in chair, comfortable States he did not walk out of the room today Sinus rhythm Blood pressure stable on low-dose dopamine Creatinine stable 2.0, hemoglobin 8.0 Continue current care

## 2018-06-30 NOTE — Progress Notes (Addendum)
TCTS DAILY ICU PROGRESS NOTE                   [address]            [place] [address]          [phone]   1 Day Post-Op Procedure(s) (LRB): CORONARY ARTERY BYPASS GRAFTING (CABG)TIMES FOUR USING THE RIGHT GREATER SAPHENOUS VEIN, HARVESTED ENDOSCOPICALLY (N/A) Transesophageal Echocardiogram (Tee) (N/A)  Total Length of Stay:  LOS: 3 days   Subjective: Patient awake and alert this am. Nurse reports he had a vagal episode when trying to stand to get his weight earlier this am.  Objective: Vital signs in last 24 hours: Temp:  [97.7 F (36.5 C)-99.3 F (37.4 C)] 98.8 F (37.1 C) (06/02 0600) Pulse Rate:  [91-110] 98 (06/02 0600) Cardiac Rhythm: Normal sinus rhythm (06/02 0400) Resp:  [11-23] 20 (06/02 0600) BP: (97-152)/(42-99) 131/42 (06/02 0600) SpO2:  [91 %-100 %] 95 % (06/02 0600) Arterial Line BP: (65-143)/(42-77) 90/42 (06/02 0600) FiO2 (%):  [40 %-100 %] 40 % (06/01 2000) Weight:  [101.1 kg] 101.1 kg (06/02 0500)  Filed Weights   06/28/18 0434 06/29/18 0509 06/30/18 0500  Weight: 106.1 kg 100.8 kg 101.1 kg    Weight change: 0.3 kg   Hemodynamic parameters for last 24 hours: PAP: (27-35)/(8-21) 27/14 CO:  [2.5 L/min-5.7 L/min] 4.7 L/min CI:  [1.2 L/min/m2-2.7 L/min/m2] 2.2 L/min/m2  Intake/Output from previous day: 06/01 0701 - 06/02 0700 In: 5712 [I.V.:3916.8; Blood:595; IV Piggyback:1200.1] Out: 9417 [Urine:4350; Blood:695; Chest Tube:440]  Intake/Output this shift: Total I/O In: 1176.2 [I.V.:947.9; IV Piggyback:228.3] Out: 1035 [Urine:775; Chest Tube:260]  Current Meds: Scheduled Meds: . acetaminophen  1,000 mg Oral Q6H   Or  . acetaminophen (TYLENOL) oral liquid 160 mg/5 mL  1,000 mg Per Tube Q6H  . aspirin EC  325 mg Oral Daily   Or  . aspirin  324 mg Per Tube Daily  . atorvastatin  80 mg Oral q1800  . bisacodyl  10 mg Oral Daily   Or  . bisacodyl  10 mg Rectal Daily  . Chlorhexidine Gluconate Cloth  6 each Topical Daily   . docusate sodium  200 mg Oral Daily  . insulin aspart  0-24 Units Subcutaneous Q4H  . mouth rinse  15 mL Mouth Rinse BID  . metoprolol tartrate  12.5 mg Oral BID   Or  . metoprolol tartrate  12.5 mg Per Tube BID  . multivitamin with minerals  1 tablet Oral Daily  . mupirocin ointment  1 application Nasal BID  . [START ON 07/01/2018] pantoprazole  40 mg Oral Daily  . rOPINIRole  2 mg Oral Daily  . rOPINIRole  4 mg Oral QHS  . sodium chloride flush  10-40 mL Intracatheter Q12H  . sodium chloride flush  3 mL Intravenous Q12H  . traZODone  100 mg Oral QHS   Continuous Infusions: . sodium chloride 10 mL/hr at 06/30/18 0600  . sodium chloride    . sodium chloride Stopped (06/29/18 2000)  . albumin human Stopped (06/29/18 1900)  . cefUROXime (ZINACEF)  IV Stopped (06/29/18 1657)  . dexmedetomidine (PRECEDEX) IV infusion Stopped (06/30/18 0444)  . DOPamine 3 mcg/kg/min (06/30/18 0600)  . famotidine (PEPCID) IV Stopped (06/29/18 1558)  . lactated ringers    . lactated ringers Stopped (06/29/18 1800)  . lactated ringers 20 mL/hr at 06/30/18 0600  . nitroGLYCERIN Stopped (06/29/18 1529)  . phenylephrine (NEO-SYNEPHRINE) Adult infusion 70 mcg/min (06/30/18 0600)  PRN Meds:.sodium chloride, albumin human, lactated ringers, metoprolol tartrate, midazolam, morphine injection, ondansetron (ZOFRAN) IV, oxyCODONE, sodium chloride flush, sodium chloride flush, traMADol  General appearance: alert, cooperative and no distress Neurologic: intact Heart: RRR, rub with chest tubes in place Lungs: Slightly diminished bibasilar breath sounds Abdomen: Soft, obese, non tender, sportadi bowel sounds Extremities: Mild LE edema Wound: Dressings intacta and clean and dry  Lab Results: CBC: Recent Labs    06/29/18 2012  06/29/18 2220 06/30/18 0402  WBC 16.7*  --   --  13.8*  HGB 10.7*   < > 9.2* 9.9*  HCT 32.8*   < > 27.0* 30.3*  PLT 222  --   --  211   < > = values in this interval not displayed.    BMET:  Recent Labs    06/29/18 0551  06/29/18 2024 06/29/18 2220 06/30/18 0402  NA 138   < > 138 139 139  K 4.9   < > 4.5 4.3 4.3  CL 105  --  106  --  110  CO2 20*  --   --   --  21*  GLUCOSE 117*   < > 143*  --  124*  BUN 24*  --  20  --  19  CREATININE 2.12*   < > 1.60*  --  1.73*  CALCIUM 9.4  --   --   --  7.4*   < > = values in this interval not displayed.    CMET: Lab Results  Component Value Date   WBC 13.8 (H) 06/30/2018   HGB 9.9 (L) 06/30/2018   HCT 30.3 (L) 06/30/2018   PLT 211 06/30/2018   GLUCOSE 124 (H) 06/30/2018   CHOL 164 06/26/2018   TRIG 118 06/26/2018   HDL 51 06/26/2018   LDLCALC 89 06/26/2018   NA 139 06/30/2018   K 4.3 06/30/2018   CL 110 06/30/2018   CREATININE 1.73 (H) 06/30/2018   BUN 19 06/30/2018   CO2 21 (L) 06/30/2018   INR 1.6 (H) 06/29/2018   HGBA1C 6.1 (H) 06/28/2018      PT/INR:  Recent Labs    06/29/18 1435  LABPROT 18.7*  INR 1.6*   Radiology: Dg Chest Port 1 View  Result Date: 06/29/2018 CLINICAL DATA:  Status post coronary bypass graft. EXAM: PORTABLE CHEST 1 VIEW COMPARISON:  Radiograph of Jun 26, 2018. FINDINGS: Cardiomegaly is again noted. Nasogastric tube is seen entering stomach. Endotracheal tube appears to be in grossly good position. Right internal jugular Swan-Ganz catheter is noted with tip directed toward right pulmonary artery. No pneumothorax is noted. Mild bibasilar subsegmental atelectasis is noted with small pleural effusions. Bony thorax is unremarkable. IMPRESSION: Endotracheal and nasogastric tubes are in good position. Mild bibasilar subsegmental atelectasis and pleural effusions is noted. No definite pneumothorax is noted. Electronically Signed   By: Marijo Conception M.D.   On: 06/29/2018 15:00     Assessment/Plan: S/P Procedure(s) (LRB): CORONARY ARTERY BYPASS GRAFTING (CABG)TIMES FOUR USING THE RIGHT GREATER SAPHENOUS VEIN, HARVESTED ENDOSCOPICALLY (N/A) Transesophageal Echocardiogram (Tee) (N/A)  1. CV-s/p NSTEMI. CO/CI 21/7. SR in the 90's this am. On Neo Synephrine and Dopamine drips. Also, Lopressor 12.5 mg bid. 2. Pulmonary-on 6 liters of oxygen via [place]. Chest tubes with 440 cc of output last 24 hours. Will leave for now. CXR this am appears to show bibasilar atelectasis and small pleural effusions. Encourage incentive spirometer 3. Volume overload-will diuresis once off Neo 4. ABL anemia-H and H this am 9.9  and 30.3 5. CKD (stage III)-creatinine decreased to 1.73 (about baseline) 6. CBGs 120/100/114. Wean off Insulin drip. Pre op HGA1C 6.1. He likely has pre diabetes. 7. Please see progression orders.   Donielle Liston Alba PA-C 06/30/2018 7:00 AM    Chart reviewed, patient examined, agree with above. He has stable cardiac output on dop 3 for renal perfusion. Creat stable at baseline.  Neuro intact. Chest tube output ok. Will remove today.

## 2018-06-30 NOTE — Progress Notes (Signed)
RT Note:  RT asked patient about wearing CPAP tonight, patient stated that he thought he would be okay without it and told me to have a good night.

## 2018-06-30 NOTE — Progress Notes (Addendum)
Pt had vagal response while attempting to stand for AM weight. Pt did not tolerate; assisted back to bed by 3 RNs. Pt able to awaken and state name. Will obtain weight via bed. Will continue to closely monitor.  Sherlie Ban, RN

## 2018-07-01 ENCOUNTER — Inpatient Hospital Stay (HOSPITAL_COMMUNITY): Payer: Medicare Other

## 2018-07-01 LAB — CBC
HCT: 23.3 % — ABNORMAL LOW (ref 39.0–52.0)
Hemoglobin: 7.4 g/dL — ABNORMAL LOW (ref 13.0–17.0)
MCH: 29.6 pg (ref 26.0–34.0)
MCHC: 31.8 g/dL (ref 30.0–36.0)
MCV: 93.2 fL (ref 80.0–100.0)
Platelets: 154 10*3/uL (ref 150–400)
RBC: 2.5 MIL/uL — ABNORMAL LOW (ref 4.22–5.81)
RDW: 14.2 % (ref 11.5–15.5)
WBC: 11 10*3/uL — ABNORMAL HIGH (ref 4.0–10.5)
nRBC: 0 % (ref 0.0–0.2)

## 2018-07-01 LAB — GLUCOSE, CAPILLARY
Glucose-Capillary: 121 mg/dL — ABNORMAL HIGH (ref 70–99)
Glucose-Capillary: 178 mg/dL — ABNORMAL HIGH (ref 70–99)
Glucose-Capillary: 108 mg/dL — ABNORMAL HIGH (ref 70–99)
Glucose-Capillary: 95 mg/dL (ref 70–99)

## 2018-07-01 LAB — BASIC METABOLIC PANEL
Anion gap: 7 (ref 5–15)
BUN: 24 mg/dL — ABNORMAL HIGH (ref 8–23)
CO2: 23 mmol/L (ref 22–32)
Calcium: 7.8 mg/dL — ABNORMAL LOW (ref 8.9–10.3)
Chloride: 106 mmol/L (ref 98–111)
Creatinine, Ser: 1.88 mg/dL — ABNORMAL HIGH (ref 0.61–1.24)
GFR calc Af Amer: 36 mL/min — ABNORMAL LOW (ref >60)
GFR calc non Af Amer: 31 mL/min — ABNORMAL LOW (ref >60)
Glucose, Bld: 139 mg/dL — ABNORMAL HIGH (ref 70–99)
Potassium: 4 mmol/L (ref 3.5–5.1)
Sodium: 136 mmol/L (ref 135–145)

## 2018-07-01 LAB — PREPARE RBC (CROSSMATCH)

## 2018-07-01 MED ORDER — FE FUMARATE-B12-VIT C-FA-IFC PO CAPS
1.0000 | ORAL_CAPSULE | Freq: Two times a day (BID) | ORAL | Status: DC
  Administered 2018-07-01 – 2018-07-06 (×11): 1 via ORAL
  Filled 2018-07-01 (×11): qty 1

## 2018-07-01 MED ORDER — SODIUM CHLORIDE 0.9% IV SOLUTION
Freq: Once | INTRAVENOUS | Status: AC
  Administered 2018-07-01: 09:00:00 via INTRAVENOUS

## 2018-07-01 MED ORDER — MIDODRINE HCL 5 MG PO TABS
5.0000 mg | ORAL_TABLET | Freq: Three times a day (TID) | ORAL | Status: DC
  Administered 2018-07-01 – 2018-07-05 (×12): 5 mg via ORAL
  Filled 2018-07-01 (×14): qty 1

## 2018-07-01 NOTE — Progress Notes (Signed)
2 Days Post-Op Procedure(s) (LRB): CORONARY ARTERY BYPASS GRAFTING (CABG)TIMES FOUR USING THE RIGHT GREATER SAPHENOUS VEIN, HARVESTED ENDOSCOPICALLY (N/A) Transesophageal Echocardiogram (Tee) (N/A) Subjective: No complaints  Objective: Vital signs in last 24 hours: Temp:  [97.7 F (36.5 C)-98.8 F (37.1 C)] 97.8 F (36.6 C) (06/03 0600) Pulse Rate:  [84-112] 100 (06/03 0700) Cardiac Rhythm: Sinus tachycardia (06/03 0400) Resp:  [14-24] 22 (06/03 0700) BP: (93-141)/(21-71) 116/52 (06/03 0700) SpO2:  [92 %-99 %] 95 % (06/03 0700) Arterial Line BP: (93-112)/(45-47) 112/47 (06/02 1000) Weight:  [106.1 kg] 106.1 kg (06/03 0500)  Hemodynamic parameters for last 24 hours: PAP: (25-35)/(11-23) 35/23  Intake/Output from previous day: 06/02 0701 - 06/03 0700 In: 1704.2 [P.O.:360; I.V.:1044.2; IV Piggyback:300] Out: 1355 [Urine:1355] Intake/Output this shift: No intake/output data recorded.  General appearance: alert and cooperative Neurologic: intact Heart: regular rate and rhythm, S1, S2 normal, no murmur Lungs: clear to auscultation bilaterally Extremities: edema mild Wound: dressings dry  Lab Results: Recent Labs    06/30/18 1755 06/30/18 1756 07/01/18 0215  WBC 11.8*  --  11.0*  HGB 8.1* 8.2* 7.4*  HCT 26.2* 24.0* 23.3*  PLT 178  --  154   BMET:  Recent Labs    06/30/18 1755 06/30/18 1756 07/01/18 0215  NA 136 136 136  K 4.3 4.3 4.0  CL 105 103 106  CO2 20*  --  23  GLUCOSE 215* 213* 139*  BUN 22 22 24*  CREATININE 2.00* 1.90* 1.88*  CALCIUM 7.6*  --  7.8*    PT/INR:  Recent Labs    06/29/18 1435  LABPROT 18.7*  INR 1.6*   ABG    Component Value Date/Time   PHART 7.397 06/29/2018 2220   HCO3 20.2 06/29/2018 2220   TCO2 21 (L) 06/30/2018 1756   ACIDBASEDEF 4.0 (H) 06/29/2018 2220   O2SAT 97.0 06/29/2018 2220   CBG (last 3)  Recent Labs    06/30/18 1602 06/30/18 2121 07/01/18 0629  GLUCAP 141* 159* 121*   CXR: bibasilar  atelectasis  Assessment/Plan: S/P Procedure(s) (LRB): CORONARY ARTERY BYPASS GRAFTING (CABG)TIMES FOUR USING THE RIGHT GREATER SAPHENOUS VEIN, HARVESTED ENDOSCOPICALLY (N/A) Transesophageal Echocardiogram (Tee) (N/A)  POD 2 Hemodynamically stable in sinus rhythm but still on neo 20 mcg and dop 3. Hgb has drifted down to 7.4 so will transfuse a unit of PRBC's which should help BP and add Midodrine 5 tid. Ideally would like to have mean BP 70 with his kidney disease. No beta blocker at this time.  Stage lll CKD: creat stable. Will hold off on diuresis while on vasopressors.  Glucose under good control on SSI.  Acute postop blood loss anemia: as above will transfuse a unit of PRBC's and start iron.  Mobilize with PT. Work on IS.     LOS: 4 days    Gaye Pollack 07/01/2018
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
## 2018-07-01 NOTE — Evaluation (Signed)
Physical Therapy Evaluation Patient Details Name: John Palmer MRN: [id number] DOB: 1925-06-11 Today's Date: 07/01/2018   History of Present Illness  85 y.o. male with a history of hyperlipidemia who presentedwith non-STEMI.  CABG x 4 on 6/1.    Clinical Impression  Pt admitted with above diagnosis. Pt currently with functional limitations due to the deficits listed below (see PT Problem List). Pt was able to ambulate with min assist and cues with [place] walker.  Needed mod assist for bed mobility and mod cues for sternal precautions.  Feel that REhab would help pt get back to independence. Will follow acutely.   Pt will benefit from skilled PT to increase their independence and safety with mobility to allow discharge to the venue listed below.      Follow Up Recommendations CIR;Supervision/Assistance - 24 hour    Equipment Recommendations  None recommended by PT    Recommendations for Other Services       Precautions / Restrictions Precautions Precautions: Fall;Sternal Precaution Booklet Issued: No Precaution Comments: Discussed sternal precautions Restrictions Weight Bearing Restrictions: Yes Other Position/Activity Restrictions: sternal precautions      Mobility  Bed Mobility               General bed mobility comments: In chair on arrival  Transfers Overall transfer level: Needs assistance Equipment used: (EVA walker) Transfers: Sit to/from Stand Sit to Stand: Min assist         General transfer comment: Needed cues for hand placement on knees for sternal precautions with pt wanting to use UEs.  Pt needed a few attempts to stand up with hands on knees using rocking motion.   Ambulation/Gait Ambulation/Gait assistance: Min assist;+2 safety/equipment Gait Distance (Feet): 220 Feet Assistive device: (Eva walker) Gait Pattern/deviations: Step-through pattern;Decreased stride length;Trunk flexed   Gait velocity interpretation: <1.31 ft/sec, indicative of  household ambulator General Gait Details: Pt overall did well with the [employer].  Needed cues to stand tall.  Several standing rest breaks as well.  Also was on 2L on arrival with sats >90%.  With ambulation, needed 6L O2 to keep sats >90%.    Stairs            Wheelchair Mobility    Modified Rankin (Stroke Patients Only)       Balance Overall balance assessment: Needs assistance Sitting-balance support: No upper extremity supported;Feet supported Sitting balance-Leahy Scale: Fair     Standing balance support: During functional activity;Bilateral upper extremity supported Standing balance-Leahy Scale: Poor Standing balance comment: relies on UEs for support                             Pertinent Vitals/Pain Pain Assessment: Faces Faces Pain Scale: Hurts little more Pain Location: incision Pain Descriptors / Indicators: Grimacing;Guarding;Discomfort Pain Intervention(s): Limited activity within patient's tolerance;Monitored during session;Repositioned    Home Living Family/patient expects to be discharged to:: Private residence Living Arrangements: Spouse/significant other Available Help at Discharge: Family;Available 24 hours/day Type of Home: House Home Access: Stairs to enter Entrance Stairs-Rails: None Entrance Stairs-Number of Steps: 1 Home Layout: One level Home Equipment: Walker - 2 wheels;Bedside commode;Shower seat(Elevated bed)      Prior Function Level of Independence: Independent               Hand Dominance        Extremity/Trunk Assessment   Upper Extremity Assessment Upper Extremity Assessment: Defer to OT evaluation    Lower Extremity  Assessment Lower Extremity Assessment: Generalized weakness    Cervical / Trunk Assessment Cervical / Trunk Assessment: Kyphotic  Communication   Communication: No difficulties  Cognition Arousal/Alertness: Awake/alert Behavior During Therapy: WFL for tasks assessed/performed Overall  Cognitive Status: Within Functional Limits for tasks assessed                                        General Comments      Exercises     Assessment/Plan    PT Assessment Patient needs continued PT services  PT Problem List Decreased activity tolerance;Decreased balance;Decreased mobility;Decreased knowledge of use of DME;Decreased safety awareness;Decreased knowledge of precautions       PT Treatment Interventions DME instruction;Gait training;Functional mobility training;Therapeutic activities;Therapeutic exercise;Balance training;Stair training;Patient/family education    PT Goals (Current goals can be found in the Care Plan section)  Acute Rehab PT Goals Patient Stated Goal: to go home PT Goal Formulation: With patient Time For Goal Achievement: 07/15/18 Potential to Achieve Goals: Good    Frequency Min 3X/week   Barriers to discharge        Co-evaluation               AM-PAC PT "6 Clicks" Mobility  Outcome Measure Help needed turning from your back to your side while in a flat bed without using bedrails?: A Lot Help needed moving from lying on your back to sitting on the side of a flat bed without using bedrails?: A Lot Help needed moving to and from a bed to a chair (including a wheelchair)?: A Little Help needed standing up from a chair using your arms (e.g., wheelchair or bedside chair)?: A Little Help needed to walk in hospital room?: A Little Help needed climbing 3-5 steps with a railing? : A Lot 6 Click Score: 15    End of Session Equipment Utilized During Treatment: Gait belt;Oxygen Activity Tolerance: Patient limited by fatigue Patient left: in chair;with call bell/phone within reach;with chair alarm set Nurse Communication: Mobility status PT Visit Diagnosis: Unsteadiness on feet (R26.81);Muscle weakness (generalized) (M62.81)    Time: 2549-8264 PT Time Calculation (min) (ACUTE ONLY): 29 min   Charges:   PT Evaluation $PT  Eval Moderate Complexity: 1 Mod PT Treatments $Gait Training: 8-22 mins        [place] Pager:  [phone]  Office:  [place] 07/01/2018, 1:54 PM

## 2018-07-01 NOTE — Progress Notes (Signed)
Patient ID: John Palmer, male   DOB: [birth year], 85 y.o.   MRN: [id number] [facility] Evening Rounds  Hemodynamically stable  Neo and dopamine off Transfused today Urine output good  Ambulated twice.

## 2018-07-01 NOTE — Progress Notes (Signed)
Patient refuses CPAP 

## 2018-07-01 NOTE — Progress Notes (Signed)
Rehab Admissions Coordinator Note:  Patient was screened by Michel Santee, PT, DPT for appropriateness for an Inpatient Acute Rehab Consult.  Note pt currently ambulating >200' with min assist.  Will continue to follow 1-2 more days to determine whether pt meets criteria for CIR.   Shann Medal, PT, DPT Admissions Coordinator [phone] 07/01/18  4:01 PM

## 2018-07-02 LAB — BASIC METABOLIC PANEL
Anion gap: 9 (ref 5–15)
BUN: 25 mg/dL — ABNORMAL HIGH (ref 8–23)
CO2: 23 mmol/L (ref 22–32)
Calcium: 8 mg/dL — ABNORMAL LOW (ref 8.9–10.3)
Chloride: 104 mmol/L (ref 98–111)
Creatinine, Ser: 1.66 mg/dL — ABNORMAL HIGH (ref 0.61–1.24)
GFR calc Af Amer: 41 mL/min — ABNORMAL LOW (ref >60)
GFR calc non Af Amer: 36 mL/min — ABNORMAL LOW (ref >60)
Glucose, Bld: 109 mg/dL — ABNORMAL HIGH (ref 70–99)
Potassium: 4 mmol/L (ref 3.5–5.1)
Sodium: 136 mmol/L (ref 135–145)

## 2018-07-02 LAB — BPAM RBC
Blood Product Expiration Date: 202006272359
ISSUE DATE / TIME: 202006030829
Unit Type and Rh: 5100

## 2018-07-02 LAB — GLUCOSE, CAPILLARY
Glucose-Capillary: 105 mg/dL — ABNORMAL HIGH (ref 70–99)
Glucose-Capillary: 109 mg/dL — ABNORMAL HIGH (ref 70–99)

## 2018-07-02 LAB — CBC
HCT: 24.9 % — ABNORMAL LOW (ref 39.0–52.0)
Hemoglobin: 8.1 g/dL — ABNORMAL LOW (ref 13.0–17.0)
MCH: 29.6 pg (ref 26.0–34.0)
MCHC: 32.5 g/dL (ref 30.0–36.0)
MCV: 90.9 fL (ref 80.0–100.0)
Platelets: 168 10*3/uL (ref 150–400)
RBC: 2.74 MIL/uL — ABNORMAL LOW (ref 4.22–5.81)
RDW: 14.8 % (ref 11.5–15.5)
WBC: 10.7 10*3/uL — ABNORMAL HIGH (ref 4.0–10.5)
nRBC: 0 % (ref 0.0–0.2)

## 2018-07-02 LAB — TYPE AND SCREEN
ABO/RH(D): O NEG
Antibody Screen: NEGATIVE
Unit division: 0

## 2018-07-02 MED ORDER — FUROSEMIDE 10 MG/ML IJ SOLN
80.0000 mg | Freq: Once | INTRAMUSCULAR | Status: AC
  Administered 2018-07-02: 80 mg via INTRAVENOUS
  Filled 2018-07-02: qty 8

## 2018-07-02 MED ORDER — SODIUM CHLORIDE 0.9 % IV SOLN: 250.0000 mL | INTRAVENOUS | Status: DC | PRN

## 2018-07-02 MED ORDER — SODIUM CHLORIDE 0.9% FLUSH: 3.0000 mL | INTRAVENOUS | Status: DC | PRN

## 2018-07-02 MED ORDER — SODIUM CHLORIDE 0.9% FLUSH
3.0000 mL | Freq: Two times a day (BID) | INTRAVENOUS | Status: DC
  Administered 2018-07-02 – 2018-07-05 (×5): 3 mL via INTRAVENOUS

## 2018-07-02 MED ORDER — MOVING RIGHT ALONG BOOK
Freq: Once | Status: AC
  Administered 2018-07-02: 11:00:00
  Filled 2018-07-02: qty 1

## 2018-07-02 MED ORDER — POTASSIUM CHLORIDE CRYS ER 20 MEQ PO TBCR
40.0000 meq | EXTENDED_RELEASE_TABLET | Freq: Once | ORAL | Status: AC
  Administered 2018-07-02: 08:00:00 40 meq via ORAL
  Filled 2018-07-02: qty 2
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
## 2018-07-02 NOTE — Progress Notes (Signed)
Patient ID: John Palmer, male   DOB: 05-14-1925, 85 y.o.   MRN: [id number] EVENING ROUNDS NOTE :     [address]       [place] [address]             [phone]                 3 Days Post-Op Procedure(s) (LRB): CORONARY ARTERY BYPASS GRAFTING (CABG)TIMES FOUR USING THE RIGHT GREATER SAPHENOUS VEIN, HARVESTED ENDOSCOPICALLY (N/A) Transesophageal Echocardiogram (Tee) (N/A)  Total Length of Stay:  LOS: 5 days  BP (!) 113/94   Pulse 96   Temp 98 F (36.7 C) (Oral)   Resp (!) 26   Ht 5\' 8"  (1.727 m)   Wt 107.8 kg   SpO2 94%   BMI 36.14 kg/m   .Intake/Output      06/03 0701 - 06/04 0700 06/04 0701 - 06/05 0700   P.O. 1140 720   I.V. (mL/kg) 356.5 (3.3) 30 (0.3)   Blood 390    IV Piggyback     Total Intake(mL/kg) 1886.5 (17.5) 750 (7)   Urine (mL/kg/hr) 1440 (0.6) 1245 (1.1)   Stool  0   Total Output 1440 1245   Net +446.5 -495        Urine Occurrence  1 x   Stool Occurrence  1 x     . sodium chloride    . sodium chloride    . lactated ringers Stopped (07/02/18 0901)     Lab Results  Component Value Date   WBC 10.7 (H) 07/02/2018   HGB 8.1 (L) 07/02/2018   HCT 24.9 (L) 07/02/2018   PLT 168 07/02/2018   GLUCOSE 109 (H) 07/02/2018   CHOL 164 06/26/2018   TRIG 118 06/26/2018   HDL 51 06/26/2018   LDLCALC 89 06/26/2018   NA 136 07/02/2018   K 4.0 07/02/2018   CL 104 07/02/2018   CREATININE 1.66 (H) 07/02/2018   BUN 25 (H) 07/02/2018   CO2 23 07/02/2018   INR 1.6 (H) 06/29/2018   HGBA1C 6.1 (H) 06/28/2018   Patient up to chair Wants to go home   [place] [phone] Office [phone] 07/02/2018 5:34 PM

## 2018-07-03 ENCOUNTER — Inpatient Hospital Stay (HOSPITAL_COMMUNITY): Payer: Medicare Other

## 2018-07-03 LAB — CBC
HCT: 26.4 % — ABNORMAL LOW (ref 39.0–52.0)
Hemoglobin: 8.5 g/dL — ABNORMAL LOW (ref 13.0–17.0)
MCH: 29.6 pg (ref 26.0–34.0)
MCHC: 32.2 g/dL (ref 30.0–36.0)
MCV: 92 fL (ref 80.0–100.0)
Platelets: 209 10*3/uL (ref 150–400)
RBC: 2.87 MIL/uL — ABNORMAL LOW (ref 4.22–5.81)
RDW: 14.8 % (ref 11.5–15.5)
WBC: 11.6 10*3/uL — ABNORMAL HIGH (ref 4.0–10.5)
nRBC: 0.4 % — ABNORMAL HIGH (ref 0.0–0.2)

## 2018-07-03 LAB — BASIC METABOLIC PANEL
Anion gap: 10 (ref 5–15)
BUN: 27 mg/dL — ABNORMAL HIGH (ref 8–23)
CO2: 20 mmol/L — ABNORMAL LOW (ref 22–32)
Calcium: 8.3 mg/dL — ABNORMAL LOW (ref 8.9–10.3)
Chloride: 106 mmol/L (ref 98–111)
Creatinine, Ser: 1.61 mg/dL — ABNORMAL HIGH (ref 0.61–1.24)
GFR calc Af Amer: 43 mL/min — ABNORMAL LOW (ref >60)
GFR calc non Af Amer: 37 mL/min — ABNORMAL LOW (ref >60)
Glucose, Bld: 115 mg/dL — ABNORMAL HIGH (ref 70–99)
Potassium: 4.2 mmol/L (ref 3.5–5.1)
Sodium: 136 mmol/L (ref 135–145)

## 2018-07-03 MED ORDER — POTASSIUM CHLORIDE CRYS ER 20 MEQ PO TBCR
20.0000 meq | EXTENDED_RELEASE_TABLET | Freq: Two times a day (BID) | ORAL | Status: AC
  Administered 2018-07-03 (×2): 20 meq via ORAL
  Filled 2018-07-03 (×2): qty 1

## 2018-07-03 MED ORDER — FUROSEMIDE 10 MG/ML IJ SOLN
40.0000 mg | Freq: Once | INTRAMUSCULAR | Status: AC
  Administered 2018-07-03: 11:00:00 40 mg via INTRAVENOUS
  Filled 2018-07-03: qty 4
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
## 2018-07-03 NOTE — Care Management Important Message (Signed)
Important Message  Patient Details  Name: John Palmer MRN: [id number] Date of Birth: 1925/05/13   Medicare Important Message Given:  Yes    Shelda Altes 07/03/2018, 12:35 PM

## 2018-07-03 NOTE — Progress Notes (Signed)
Pt assisted to ambulate in hall using RW.  approx 100 ft, with 3 standing rests.  Stated "I usually can do better than this."  Reassured that it is still  Making an impact.   Able to do walk with no supplemental oxygen.  SPO2 on RA 99% before walk, 95% after.  HR remained low 100's for duration. Back to recliner after walk and assisted to order lunch.  Will con't plan of care.

## 2018-07-03 NOTE — Progress Notes (Signed)
EPWs removed per order.  Pt tolerated well, all tips intact, sites unremarkable.  VSS see flowsheet. Pt understands bedrest for 1 hr with frequent vitals.  CCMD notified, will monitor closely.

## 2018-07-03 NOTE — Progress Notes (Signed)
CARDIAC REHAB PHASE I   Pt was in chair eating breakfast. Educated on IS use, sternal precautions, exercise guidelines, restrictions, nutrition, and CRP II. Moving Right Along booklet at bedside and Pt stated he has been reading it. Pt stated he has been walking the full length of the hallway multiple times a day. Will refer to CRP II [place].  [phone]  Jeralyn Bennett BS, ACSM CEP 07/03/2018  8:54 AM

## 2018-07-03 NOTE — Progress Notes (Signed)
RT offered pt CPAP dream station for the night and pt declined stating he did not care for our equipment that it was not comfortable. RT expressed to pt that if he changes his mind to call. Pt respiratory status stable at this time. RT will continue to monitor.

## 2018-07-03 NOTE — Progress Notes (Signed)
Physical Therapy Treatment Patient Details Name: John Palmer MRN: [id number] DOB: [birth year] Today's Date: 07/03/2018    History of Present Illness 85 y.o. male with a history of hyperlipidemia who presentedwith non-STEMI.  CABG x 4 on 6/1.      PT Comments    Patient seen for mobility progression - has ambulated in hallway with nursing staff as well. Patient with light Min A to rise from recliner with cueing for hand placement for safety and sternal precautions. Patient ambulating on RA with SpO2 >95% throughout on RA. Min A to return to supine for LE and trunk management. Will continue to follow.    Follow Up Recommendations  CIR;Supervision/Assistance - 24 hour     Equipment Recommendations  None recommended by PT    Recommendations for Other Services       Precautions / Restrictions Precautions Precautions: Fall;Sternal Precaution Comments: Discussed sternal precautions    Mobility  Bed Mobility Overal bed mobility: Needs Assistance Bed Mobility: Sit to Sidelying;Rolling Rolling: Min guard       Sit to sidelying: Min assist General bed mobility comments: Min A to bring LE into bed  Transfers Overall transfer level: Needs assistance Equipment used: Rolling walker (2 wheeled) Transfers: Sit to/from Stand Sit to Stand: Min assist;Min guard         General transfer comment: light min A to rise from recliner; cueing for safety  Ambulation/Gait Ambulation/Gait assistance: Min guard Gait Distance (Feet): 140 Feet Assistive device: Rolling walker (2 wheeled) Gait Pattern/deviations: Step-through pattern;Decreased stride length;Trunk flexed Gait velocity: decreased   General Gait Details: patient with slow steady pace of gait; required 3 standing rest breaks with SpO2 >95% throughout mobility on RA   Stairs             Wheelchair Mobility    Modified Rankin (Stroke Patients Only)       Balance Overall balance assessment: Needs  assistance Sitting-balance support: No upper extremity supported;Feet supported Sitting balance-Leahy Scale: Good     Standing balance support: Bilateral upper extremity supported;During functional activity Standing balance-Leahy Scale: Fair Standing balance comment: relies on UEs for support                            Cognition Arousal/Alertness: Awake/alert Behavior During Therapy: WFL for tasks assessed/performed Overall Cognitive Status: Within Functional Limits for tasks assessed                                        Exercises      General Comments        Pertinent Vitals/Pain Pain Assessment: No/denies pain    Home Living                      Prior Function            PT Goals (current goals can now be found in the care plan section) Acute Rehab PT Goals Patient Stated Goal: to go home PT Goal Formulation: With patient Time For Goal Achievement: 07/15/18 Potential to Achieve Goals: Good Progress towards PT goals: Progressing toward goals    Frequency    Min 3X/week      PT Plan Current plan remains appropriate    Co-evaluation              AM-PAC PT "6 Clicks" Mobility  Outcome Measure  Help needed turning from your back to your side while in a flat bed without using bedrails?: A Little Help needed moving from lying on your back to sitting on the side of a flat bed without using bedrails?: A Lot Help needed moving to and from a bed to a chair (including a wheelchair)?: A Little Help needed standing up from a chair using your arms (e.g., wheelchair or bedside chair)?: A Little Help needed to walk in hospital room?: A Little Help needed climbing 3-5 steps with a railing? : A Lot 6 Click Score: 16    End of Session Equipment Utilized During Treatment: Gait belt Activity Tolerance: Patient tolerated treatment well Patient left: in bed;with call bell/phone within reach Nurse Communication: Mobility  status PT Visit Diagnosis: Unsteadiness on feet (R26.81);Muscle weakness (generalized) (M62.81)     Time: 1655-3748 PT Time Calculation (min) (ACUTE ONLY): 12 min  Charges:  $Gait Training: 8-22 mins                      Lanney Gins, PT, DPT Supplemental Physical Therapist 07/03/18 3:20 PM Pager: [phone] Office: [phone]

## 2018-07-04 LAB — BASIC METABOLIC PANEL
Anion gap: 10 (ref 5–15)
BUN: 24 mg/dL — ABNORMAL HIGH (ref 8–23)
CO2: 24 mmol/L (ref 22–32)
Calcium: 8.5 mg/dL — ABNORMAL LOW (ref 8.9–10.3)
Chloride: 103 mmol/L (ref 98–111)
Creatinine, Ser: 1.77 mg/dL — ABNORMAL HIGH (ref 0.61–1.24)
GFR calc Af Amer: 38 mL/min — ABNORMAL LOW (ref >60)
GFR calc non Af Amer: 33 mL/min — ABNORMAL LOW (ref >60)
Glucose, Bld: 119 mg/dL — ABNORMAL HIGH (ref 70–99)
Potassium: 4.4 mmol/L (ref 3.5–5.1)
Sodium: 137 mmol/L (ref 135–145)

## 2018-07-04 MED ORDER — MULTIVITAMINS PO CAPS: 1.0000 | ORAL_CAPSULE | Freq: Every day | ORAL | Status: DC

## 2018-07-04 MED ORDER — FUROSEMIDE 10 MG/ML IJ SOLN
40.0000 mg | Freq: Two times a day (BID) | INTRAMUSCULAR | Status: AC
  Administered 2018-07-04 (×2): 40 mg via INTRAVENOUS
  Filled 2018-07-04 (×2): qty 4

## 2018-07-04 MED ORDER — FERROUS SULFATE 325 (65 FE) MG PO TABS: 325.0000 mg | ORAL_TABLET | Freq: Every day | ORAL | 3 refills | Status: DC

## 2018-07-04 MED ORDER — ASPIRIN 325 MG PO TBEC: 325.0000 mg | DELAYED_RELEASE_TABLET | Freq: Every day | ORAL | 0 refills | Status: DC

## 2018-07-04 MED ORDER — ATORVASTATIN CALCIUM 80 MG PO TABS: 80.0000 mg | ORAL_TABLET | Freq: Every day | ORAL | 1 refills | Status: DC

## 2018-07-04 MED ORDER — TRAMADOL HCL 50 MG PO TABS: 50.0000 mg | ORAL_TABLET | Freq: Two times a day (BID) | ORAL | 0 refills | Status: DC | PRN

## 2018-07-04 NOTE — Progress Notes (Signed)
Pt ambulated around unit x 150 feet with 2 rest brakes pt tolerated well back to chair will continue to monitor

## 2018-07-04 NOTE — Plan of Care (Signed)
  Problem: Education: Goal: Knowledge of General Education information will improve Description Including pain rating scale, medication(s)/side effects and non-pharmacologic comfort measures Outcome: Progressing   Problem: Health Behavior/Discharge Planning: Goal: Ability to manage health-related needs will improve Outcome: Progressing   

## 2018-07-04 NOTE — Progress Notes (Signed)
Progress Note  Patient Name: John Palmer Date of Encounter: 07/04/2018  Primary Cardiologist: No primary care provider on file. New  Subjective   Sitting up in chair, sleeping, comfortable.  Inpatient Medications    Scheduled Meds: . acetaminophen  1,000 mg Oral Q6H   Or  . acetaminophen (TYLENOL) oral liquid 160 mg/5 mL  1,000 mg Per Tube Q6H  . aspirin EC  325 mg Oral Daily   Or  . aspirin  324 mg Per Tube Daily  . atorvastatin  80 mg Oral q1800  . bisacodyl  10 mg Oral Daily   Or  . bisacodyl  10 mg Rectal Daily  . docusate sodium  200 mg Oral Daily  . enoxaparin (LOVENOX) injection  30 mg Subcutaneous QHS  . ferrous [id number] C-folic acid  1 capsule Oral BID PC  . furosemide  40 mg Intravenous BID  . midodrine  5 mg Oral TID WC  . multivitamin with minerals  1 tablet Oral Daily  . pantoprazole  40 mg Oral Daily  . rOPINIRole  2 mg Oral Daily  . rOPINIRole  4 mg Oral QHS  . sodium chloride flush  3 mL Intravenous Q12H  . traZODone  100 mg Oral QHS   Continuous Infusions: . sodium chloride     PRN Meds: sodium chloride, ondansetron (ZOFRAN) IV, oxyCODONE, sodium chloride flush, traMADol   Vital Signs    Vitals:   07/03/18 2050 07/03/18 2325 07/04/18 0356 07/04/18 0757  BP: 123/61 (!) 139/50 (!) 120/59 126/65  Pulse: 91 82 86   Resp: 15 20 20    Temp: 97.6 F (36.4 C) 98 F (36.7 C) 97.6 F (36.4 C) 98.1 F (36.7 C)  TempSrc: Oral Oral Oral Oral  SpO2: 96% 92% 94% 96%  Weight:   105.7 kg   Height:        Intake/Output Summary (Last 24 hours) at 07/04/2018 1124 Last data filed at 07/03/2018 2309 Gross per 24 hour  Intake 240 ml  Output 1875 ml  Net -1635 ml   Last 3 Weights 07/04/2018 07/03/2018 07/02/2018  Weight (lbs) 233 lb 229 lb 15 oz 237 lb 10.5 oz  Weight (kg) 105.688 kg 104.3 kg 107.8 kg      Telemetry    Occasional PVCs, first-degree AV block, sinus rhythm heart rate 80s to 90s- Personally Reviewed  ECG    Sinus rhythm  first-degree AV block right bundle branch block- Personally Reviewed  Physical Exam   GEN: No acute distress.  Sitting in chair Neck: No JVD Cardiac: RRR, no murmurs, rubs, or gallops.  Respiratory: Clear to auscultation bilaterally. GI: Soft, nontender, non-distended  MS: No edema; No deformity. Neuro:  Nonfocal  Psych: Sleeping  Labs    Chemistry Recent Labs  Lab 07/02/18 0154 07/03/18 0250 07/04/18 0536  NA 136 136 137  K 4.0 4.2 4.4  CL 104 106 103  CO2 23 20* 24  GLUCOSE 109* 115* 119*  BUN 25* 27* 24*  CREATININE 1.66* 1.61* 1.77*  CALCIUM 8.0* 8.3* 8.5*  GFRNONAA 36* 37* 33*  GFRAA 41* 43* 38*  ANIONGAP 9 10 10      Hematology Recent Labs  Lab 07/01/18 0215 07/02/18 0154 07/03/18 0250  WBC 11.0* 10.7* 11.6*  RBC 2.50* 2.74* 2.87*  HGB 7.4* 8.1* 8.5*  HCT 23.3* 24.9* 26.4*  MCV 93.2 90.9 92.0  MCH 29.6 29.6 29.6  MCHC 31.8 32.5 32.2  RDW 14.2 14.8 14.8  PLT 154 168 209  Cardiac EnzymesNo results for input(s): TROPONINI in the last 168 hours. No results for input(s): TROPIPOC in the last 168 hours.   BNPNo results for input(s): BNP, PROBNP in the last 168 hours.   DDimer No results for input(s): DDIMER in the last 168 hours.   Radiology    Dg Chest 2 View  Result Date: 07/03/2018 CLINICAL DATA:  Pleural effusions EXAM: CHEST - 2 VIEW COMPARISON:  07/01/2018 FINDINGS: Small bilateral pleural effusions are similar to prior study. Left base atelectasis. Mild cardiomegaly. Prior CABG. No overt edema or acute bony abnormality. IMPRESSION: Small bilateral pleural effusions.  Left base atelectasis. Mild cardiomegaly. Electronically Signed   By: Rolm Baptise M.D.   On: 07/03/2018 09:42    Cardiac Studies    1. The left ventricle has normal systolic function with an ejection fraction of 60-65%. The cavity size was normal. Left ventricular diastolic Doppler parameters are consistent with pseudonormalization.  2. The right ventricle has low normal systolic  function. The cavity was mildly enlarged. There is no increase in right ventricular wall thickness.  3. Left atrial size was mildly dilated.  4. Right atrial size was mildly dilated.  5. Mitral valve regurgitation is mild to moderate by color flow Doppler.  6. Moderate thickening of the aortic valve. Moderate calcification of the aortic valve. Aortic valve regurgitation is mild by color flow Doppler. Moderate aortic annular calcification noted.  7. The tricuspid valve is not well visualized. Tricuspid valve regurgitation was not assessed by color flow Doppler.  8. The aortic root and ascending aorta are normal in size and structure.  9. The interatrial septum was not assessed.  Patient Profile     85 y.o. male status post CABG  Assessment & Plan    Coronary artery disease status post CABG - Progressing.  Secondary risk factor prevention.  Consider low-dose Lasix perhaps 20 mg on discharge.  First-degree AV block - Stable, no pauses.  Avoiding AV nodal blocking agents such as metoprolol.  Hypotension -midodrine.   Non-ST elevation myocardial infarction - Currently on aspirin, high intensity statin.  On discharge consider clopidogrel 75 mg once a day for up to 1 year.      For questions or updates, please contact [place] Please consult [email or web address] for contact info under        Signed, Candee Furbish, MD  07/04/2018, 11:24 AM
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
## 2018-07-04 NOTE — Progress Notes (Signed)
CARDIAC REHAB PHASE I   PRE:  Rate/Rhythm: 97 SR  BP:  Supine:   Sitting: 141/72  Standing:    SaO2: 94% RA  MODE:  Ambulation: 200 ft   POST:  Rate/Rhythm: 99 SR  BP:  Supine:   Sitting: 114/60  Standing:    SaO2: 93% RA Tolerated ambulation well with rolling walker and minimal assistance, stopped to rest x3 due to shortness of breath, very hard of hearing, very pleasant. [phone] Liliane Channel RN, BSN 07/04/2018 11:25 AM

## 2018-07-04 NOTE — Plan of Care (Signed)
  Problem: Education: Goal: Knowledge of General Education information will improve Description Including pain rating scale, medication(s)/side effects and non-pharmacologic comfort measures 07/04/2018 2110 by Drenda Freeze, RN Outcome: Progressing 07/04/2018 2104 by Drenda Freeze, RN Outcome: Progressing   Problem: Health Behavior/Discharge Planning: Goal: Ability to manage health-related needs will improve 07/04/2018 2110 by Drenda Freeze, RN Outcome: Progressing 07/04/2018 2104 by Drenda Freeze, RN Outcome: Progressing

## 2018-07-05 LAB — BASIC METABOLIC PANEL
Anion gap: 11 (ref 5–15)
BUN: 30 mg/dL — ABNORMAL HIGH (ref 8–23)
CO2: 26 mmol/L (ref 22–32)
Calcium: 8.6 mg/dL — ABNORMAL LOW (ref 8.9–10.3)
Chloride: 101 mmol/L (ref 98–111)
Creatinine, Ser: 2.14 mg/dL — ABNORMAL HIGH (ref 0.61–1.24)
GFR calc Af Amer: 30 mL/min — ABNORMAL LOW (ref >60)
GFR calc non Af Amer: 26 mL/min — ABNORMAL LOW (ref >60)
Glucose, Bld: 113 mg/dL — ABNORMAL HIGH (ref 70–99)
Potassium: 4.4 mmol/L (ref 3.5–5.1)
Sodium: 138 mmol/L (ref 135–145)

## 2018-07-05 MED ORDER — FUROSEMIDE 40 MG PO TABS
40.0000 mg | ORAL_TABLET | Freq: Every day | ORAL | Status: DC
  Administered 2018-07-05 – 2018-07-06 (×2): 40 mg via ORAL
  Filled 2018-07-05 (×2): qty 1

## 2018-07-05 MED ORDER — LEVALBUTEROL HCL 0.63 MG/3ML IN NEBU: 0.6300 mg | INHALATION_SOLUTION | Freq: Three times a day (TID) | RESPIRATORY_TRACT | Status: DC | PRN

## 2018-07-05 NOTE — Progress Notes (Signed)
Progress Note  Patient Name: John Palmer Date of Encounter: 07/05/2018  Primary Cardiologist: No primary care provider on file. New  Subjective   Was really hoping to go home but creatinine increased.  No chest pain.  Inpatient Medications    Scheduled Meds: . aspirin EC  325 mg Oral Daily   Or  . aspirin  324 mg Per Tube Daily  . atorvastatin  80 mg Oral q1800  . bisacodyl  10 mg Oral Daily   Or  . bisacodyl  10 mg Rectal Daily  . docusate sodium  200 mg Oral Daily  . enoxaparin (LOVENOX) injection  30 mg Subcutaneous QHS  . ferrous [id number] C-folic acid  1 capsule Oral BID PC  . furosemide  40 mg Oral Daily  . multivitamin with minerals  1 tablet Oral Daily  . pantoprazole  40 mg Oral Daily  . rOPINIRole  2 mg Oral Daily  . rOPINIRole  4 mg Oral QHS  . sodium chloride flush  3 mL Intravenous Q12H  . traZODone  100 mg Oral QHS   Continuous Infusions: . sodium chloride     PRN Meds: sodium chloride, levalbuterol, ondansetron (ZOFRAN) IV, oxyCODONE, sodium chloride flush, traMADol   Vital Signs    Vitals:   07/04/18 2003 07/04/18 2300 07/05/18 0333 07/05/18 0846  BP: 90/63 (!) 139/58 (!) 117/55 118/60  Pulse:   86 93  Resp:  (!) 21 16 16   Temp: 98.5 F (36.9 C) 97.6 F (36.4 C) 98 F (36.7 C) 97.9 F (36.6 C)  TempSrc: Oral Oral Oral Oral  SpO2: 95% 95% 93% 90%  Weight:   103.5 kg   Height:       No intake or output data in the 24 hours ending 07/05/18 1128 Last 3 Weights 07/05/2018 07/04/2018 07/03/2018  Weight (lbs) 228 lb 2.8 oz 233 lb 229 lb 15 oz  Weight (kg) 103.5 kg 105.688 kg 104.3 kg      Telemetry    First-degree AV block sinus rhythm occasional PVCs- Personally Reviewed  ECG    No new- Personally Reviewed  Physical Exam   GEN: No acute distress.  Up in chair Neck: No JVD Cardiac: RRR, no murmurs, rubs, or gallops.  Respiratory: Clear to auscultation bilaterally. GI: Soft, nontender, non-distended  MS:  3+ lower  extremity edema; No deformity. Neuro:  Nonfocal  Psych: Normal affect   Labs    Chemistry Recent Labs  Lab 07/03/18 0250 07/04/18 0536 07/05/18 0323  NA 136 137 138  K 4.2 4.4 4.4  CL 106 103 101  CO2 20* 24 26  GLUCOSE 115* 119* 113*  BUN 27* 24* 30*  CREATININE 1.61* 1.77* 2.14*  CALCIUM 8.3* 8.5* 8.6*  GFRNONAA 37* 33* 26*  GFRAA 43* 38* 30*  ANIONGAP 10 10 11      Hematology Recent Labs  Lab 07/01/18 0215 07/02/18 0154 07/03/18 0250  WBC 11.0* 10.7* 11.6*  RBC 2.50* 2.74* 2.87*  HGB 7.4* 8.1* 8.5*  HCT 23.3* 24.9* 26.4*  MCV 93.2 90.9 92.0  MCH 29.6 29.6 29.6  MCHC 31.8 32.5 32.2  RDW 14.2 14.8 14.8  PLT 154 168 209    Cardiac EnzymesNo results for input(s): TROPONINI in the last 168 hours. No results for input(s): TROPIPOC in the last 168 hours.   BNPNo results for input(s): BNP, PROBNP in the last 168 hours.   DDimer No results for input(s): DDIMER in the last 168 hours.   Radiology    No  results found.  Cardiac Studies   Ejection fraction 65%  Patient Profile     85 y.o. male with coronary disease post CABG with acute kidney injury  Assessment & Plan    Coronary artery disease post CABG -Progressing well.  Creatinine has increased however.  Clearly has lower extremity edema. at discharge, one could consider low-dose Lasix perhaps 20 mg or hold on diuretic and repeat basic metabolic profile as outpatient.  First-degree AV block -Stable without any pauses.  Avoid metoprolol.  Hypotension - On midodrine.  This is likely also contributing to his acute kidney injury.  Non-ST elevation myocardial infarction -Given his advanced age, we will continue with aspirin only given his recent surgical state and not include clopidogrel.     For questions or updates, please contact [place] Please consult [email or web address] for contact info under        Signed, Candee Furbish, MD  07/05/2018, 11:28 AM

## 2018-07-05 NOTE — Progress Notes (Signed)
Pt states he does not want to wear CPAP tonight. He states he does not like our mask.
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
## 2018-07-05 NOTE — Plan of Care (Signed)
  Problem: Education: Goal: Knowledge of General Education information will improve Description Including pain rating scale, medication(s)/side effects and non-pharmacologic comfort measures Outcome: Progressing   Problem: Health Behavior/Discharge Planning: Goal: Ability to manage health-related needs will improve Outcome: Progressing   

## 2018-07-06 LAB — BASIC METABOLIC PANEL
Anion gap: 12 (ref 5–15)
BUN: 30 mg/dL — ABNORMAL HIGH (ref 8–23)
CO2: 24 mmol/L (ref 22–32)
Calcium: 8.8 mg/dL — ABNORMAL LOW (ref 8.9–10.3)
Chloride: 101 mmol/L (ref 98–111)
Creatinine, Ser: 2.05 mg/dL — ABNORMAL HIGH (ref 0.61–1.24)
GFR calc Af Amer: 32 mL/min — ABNORMAL LOW (ref >60)
GFR calc non Af Amer: 28 mL/min — ABNORMAL LOW (ref >60)
Glucose, Bld: 131 mg/dL — ABNORMAL HIGH (ref 70–99)
Potassium: 3.7 mmol/L (ref 3.5–5.1)
Sodium: 137 mmol/L (ref 135–145)

## 2018-07-06 MED ORDER — LEVALBUTEROL HCL 0.63 MG/3ML IN NEBU
0.6300 mg | INHALATION_SOLUTION | Freq: Once | RESPIRATORY_TRACT | Status: AC
  Administered 2018-07-06: 08:00:00 0.63 mg via RESPIRATORY_TRACT
  Filled 2018-07-06: qty 3

## 2018-07-06 MED ORDER — FUROSEMIDE 40 MG PO TABS: 40.0000 mg | ORAL_TABLET | Freq: Every day | ORAL | 0 refills | Status: DC

## 2018-07-06 NOTE — Progress Notes (Signed)
.  CARDIAC REHAB PHASE I   PRE:  Rate/Rhythm: 89 SR 1HB  BP:  Supine:   Sitting: 124/52  Standing:    SaO2: 94%RA  MODE:  Ambulation: 375 ft   POST:  Rate/Rhythm: 108 ST 1HB few PVCs  BP:  Supine:   Sitting: 136/49  Standing:    SaO2: 90-93%RA  Whole walk [phone] Pt walked 375 ft on RA with steady gait and minimal asst. Pt stopped eight times to catch his breath. Would recommend rolling walker for home use RN aware. Pt to sitting on side of bed after walk. Tolerated well. Pt knows when he needs to stop and catch his breath before he gets too exhausted.   Graylon Good, RN BSN  07/06/2018 8:49 AM

## 2018-07-06 NOTE — Progress Notes (Signed)
[facility] will sign off.   Medication Recommendations:  See yesterday's note  Other recommendations (labs, testing, etc):  none Follow up as an outpatient:  Has been arranged
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
## 2018-07-06 NOTE — Progress Notes (Signed)
PA updated that family would like to speak with Dr. Beaulah Corin regarding care and plan. Jerald Kief, RN

## 2018-07-06 NOTE — Discharge Instructions (Signed)
Discharge Instructions:  1. You may shower, please wash incisions daily with soap and water and keep dry.  If you wish to cover wounds with dressing you may do so but please keep clean and change daily.  No tub baths or swimming until incisions have completely healed.  If your incisions become red or develop any drainage please call our office at [phone]  2. No Driving until cleared by Dr. Vivi Martens office and you are no longer using narcotic pain medications  3. Monitor your weight daily.. Please use the same scale and weigh at same time... If you gain 5-10 lbs in 48 hours with associated lower extremity swelling, please contact our office at [phone]  4. Fever of 101.5 for at least 24 hours with no source, please contact our office at [phone]  5. Activity- up as tolerated, please walk at least 3 times per day.  Avoid strenuous activity, no lifting, pushing, or pulling with your arms over 8-10 lbs for a minimum of 6 weeks  6. If any questions or concerns arise, please do not hesitate to contact our office at [place] - PLEASE REVIEW IMPORTANT INFORMATION BELOW SEVERAL DAYS PRIOR TO YOUR APPOINTMENT  Due to the recent COVID-19 pandemic, we are transitioning in-person office visits to tele-medicine visits in an effort to decrease unnecessary exposure to our patients, their families, and staff. These visits are billed to your insurance just like a normal visit is. We also encourage you to sign up for MyChart if you have not already done so. You will need a smartphone if possible. For patients that do not have this, we can still complete the visit using a regular telephone but do prefer a smartphone to enable video when possible. You may have a family member that lives with you that can help. If possible, we also ask that you have a blood pressure cuff and scale at home to measure your blood pressure, heart rate  and weight prior to your scheduled appointment. Patients with clinical needs that need an in-person evaluation and testing will still be able to come to the office if absolutely necessary. If you have any questions, feel free to call our office.   YOUR PROVIDER WILL BE USING THE FOLLOWING PLATFORM TO COMPLETE YOUR VISIT   IF USING MYCHART - How to Download the MyChart App to Your SmartPhone   - If Apple, go to [employer] and type in MyChart in the search bar and download the app. If Android, ask patient to go to [employer] and type in [place] in the search bar and download the app. The app is free but as with any other app downloads, your phone may require you to verify saved payment information or Apple/Android password.  - You will need to then log into the app with your MyChart username and password, and select [facility] as your healthcare provider to link the account.  - When it is time for your visit, go to the MyChart app, find appointments, and click Begin Video Visit. Be sure to Select Allow for your device to access the Microphone and Camera for your visit. You will then be connected, and your provider will be with you shortly.  **If you have any issues connecting or need assistance, please contact MyChart service desk [phone])**  **If using a computer, in order to ensure the best quality for your visit, you will need to  use either of the following Internet Browsers: Insurance underwriter or Microsoft Edge**   IF USING DOXIMITY or DOXY.ME - The staff will give you instructions on receiving your link to join the meeting the day of your visit.      2-3 DAYS BEFORE YOUR APPOINTMENT  You will receive a telephone call from one of our [place] team members - your caller ID may say "Unknown caller." If this is a video visit, we will walk you through how to get the video launched on your phone. We will remind you check your blood pressure, heart rate and weight prior to  your scheduled appointment. If you have an Apple Watch or Kardia, please upload any pertinent ECG strips the day before or morning of your appointment to [place]. Our staff will also make sure you have reviewed the consent and agree to move forward with your scheduled tele-health visit.     THE DAY OF YOUR APPOINTMENT  Approximately 15 minutes prior to your scheduled appointment, you will receive a telephone call from one of [place] [place] team - your caller ID may say "Unknown caller."  Our staff will confirm medications, vital signs for the day and any symptoms you may be experiencing. Please have this information available prior to the time of visit start. It may also be helpful for you to have a pad of paper and pen handy for any instructions given during your visit. They will also walk you through joining the smartphone meeting if this is a video visit.    CONSENT FOR TELE-HEALTH VISIT - PLEASE REVIEW  I hereby voluntarily request, consent and authorize [facility] and its employed or contracted physicians, physician assistants, nurse practitioners or other licensed health care professionals (the Practitioner), to provide me with telemedicine health care services (the Services") as deemed necessary by the treating Practitioner. I acknowledge and consent to receive the Services by the Practitioner via telemedicine. I understand that the telemedicine visit will involve communicating with the Practitioner through live audiovisual communication technology and the disclosure of certain medical information by electronic transmission. I acknowledge that I have been given the opportunity to request an in-person assessment or other available alternative prior to the telemedicine visit and am voluntarily participating in the telemedicine visit.  I understand that I have the right to withhold or withdraw my consent to the use of telemedicine in the course of my care at any time, without affecting my right to  future care or treatment, and that the Practitioner or I may terminate the telemedicine visit at any time. I understand that I have the right to inspect all information obtained and/or recorded in the course of the telemedicine visit and may receive copies of available information for a reasonable fee.  I understand that some of the potential risks of receiving the Services via telemedicine include:   Delay or interruption in medical evaluation due to technological equipment failure or disruption;  Information transmitted may not be sufficient (e.g. poor resolution of images) to allow for appropriate medical decision making by the Practitioner; and/or   In rare instances, security protocols could fail, causing a breach of personal health information.  Furthermore, I acknowledge that it is my responsibility to provide information about my medical history, conditions and care that is complete and accurate to the best of my ability. I acknowledge that Practitioner's advice, recommendations, and/or decision may be based on factors not within their control, such as incomplete or inaccurate data provided by me or distortions of diagnostic  images or specimens that may result from electronic transmissions. I understand that the practice of medicine is not an exact science and that Practitioner makes no warranties or guarantees regarding treatment outcomes. I acknowledge that I will receive a copy of this consent concurrently upon execution via email to the email address I last provided but may also request a printed copy by calling the office of [facility].    I understand that my insurance will be billed for this visit.   I have read or had this consent read to me.  I understand the contents of this consent, which adequately explains the benefits and risks of the Services being provided via telemedicine.   I have been provided ample opportunity to ask questions regarding this consent and the Services  and have had my questions answered to my satisfaction.  I give my informed consent for the services to be provided through the use of telemedicine in my medical care  By participating in this telemedicine visit I agree to the above.

## 2018-07-06 NOTE — Progress Notes (Signed)
Pt provided discharge instruction and education. IV removed and intact. Vitals stable. CCMD notified/Telebox 21 removed. Pt has all belongings. Family notified to pick pt up at [place] parking. Volunteers to tx via wheelchair.  Jerald Kief, RN

## 2018-07-06 NOTE — TOC Transition Note (Signed)
Transition of Care [facility]) - CM/SW Discharge Note Marvetta Gibbons RN, BSN Transitions of Care Unit 4E- RN Case Manager [phone]   Patient Details  Name: John Palmer MRN: [id number] Date of Birth: September 12, 1925  Transition of Care [facility]) CM/SW Contact:  Dawayne Patricia, RN Phone Number: 07/06/2018, 10:53 AM   Clinical Narrative:    Pt admitted with [place], s/p CABG. Stable for transition home with spouse. Order placed for HHRN/PT and DME RW. Per wife they would like to use [facility]- call made to [facility] and spoke with Cassie- orders faxed to [phone] via epic. Call also placed to [place] with [employer] for DME- RW- equipment to be delivered to room prior to discharge.    Final next level of care: [place] Barriers to Discharge: No Barriers Identified   Patient Goals and CMS Choice Patient states their goals for this hospitalization and ongoing recovery are:: retun home CMS Medicare.gov Compare Post Acute Care list provided to:: Patient Represenative (must comment) Choice offered to / list presented to : Spouse  Discharge Placement  home with [facility]                     Discharge Plan and Services   Discharge Planning Services: CM Consult Post Acute Care Choice: Home Health, Durable Medical Equipment          DME Arranged: [place] rolling DME Agency: [employer] Date DME Agency Contacted: 07/06/18 Time DME Agency Contacted: [phone] Representative spoke with at DME Agency: Andree Coss HH Arranged: RN, PT [place] Agency: [place] Date [place]: 07/06/18 Time [place]: 26 Representative spoke with at [place]: Cassie  Social Determinants of Health ([place]) Interventions     Readmission Risk Interventions Readmission Risk Prevention Plan 07/06/2018  Transportation Screening Complete  PCP or Specialist Appt within 5-7 Days Complete  Home Care Screening Complete  Medication Review (RN CM) Complete   Some recent data might be hidden

## 2018-07-07 DIAGNOSIS — Z48812 Encounter for surgical aftercare following surgery on the circulatory system: Secondary | ICD-10-CM | POA: Diagnosis not present

## 2018-07-07 DIAGNOSIS — I2510 Atherosclerotic heart disease of native coronary artery without angina pectoris: Secondary | ICD-10-CM | POA: Diagnosis not present

## 2018-07-07 DIAGNOSIS — I214 Non-ST elevation (NSTEMI) myocardial infarction: Secondary | ICD-10-CM | POA: Diagnosis not present

## 2018-07-07 DIAGNOSIS — Z951 Presence of aortocoronary bypass graft: Secondary | ICD-10-CM | POA: Diagnosis not present

## 2018-07-07 DIAGNOSIS — F329 Major depressive disorder, single episode, unspecified: Secondary | ICD-10-CM | POA: Diagnosis not present

## 2018-07-07 DIAGNOSIS — N183 Chronic kidney disease, stage 3 (moderate): Secondary | ICD-10-CM | POA: Diagnosis not present

## 2018-07-07 DIAGNOSIS — E785 Hyperlipidemia, unspecified: Secondary | ICD-10-CM | POA: Diagnosis not present

## 2018-07-07 DIAGNOSIS — K219 Gastro-esophageal reflux disease without esophagitis: Secondary | ICD-10-CM | POA: Diagnosis not present

## 2018-07-07 DIAGNOSIS — Z9181 History of falling: Secondary | ICD-10-CM | POA: Diagnosis not present

## 2018-07-07 DIAGNOSIS — Z7982 Long term (current) use of aspirin: Secondary | ICD-10-CM | POA: Diagnosis not present

## 2018-07-07 DIAGNOSIS — D631 Anemia in chronic kidney disease: Secondary | ICD-10-CM | POA: Diagnosis not present

## 2018-07-07 MED FILL — Lidocaine HCl Local Soln Prefilled Syringe 100 MG/5ML (2%): INTRAMUSCULAR | Qty: 5 | Status: AC

## 2018-07-07 MED FILL — Sodium Chloride IV Soln 0.9%: INTRAVENOUS | Qty: 2000 | Status: AC

## 2018-07-07 MED FILL — Potassium Chloride Inj 2 mEq/ML: INTRAVENOUS | Qty: 40 | Status: AC

## 2018-07-07 MED FILL — Heparin Sodium (Porcine) Inj 1000 Unit/ML: INTRAMUSCULAR | Qty: 30 | Status: AC

## 2018-07-07 MED FILL — Electrolyte-R (PH 7.4) Solution: INTRAVENOUS | Qty: 4000 | Status: AC

## 2018-07-07 MED FILL — Mannitol IV Soln 20%: INTRAVENOUS | Qty: 500 | Status: AC

## 2018-07-07 MED FILL — Magnesium Sulfate Inj 50%: INTRAMUSCULAR | Qty: 10 | Status: AC

## 2018-07-07 MED FILL — Sodium Bicarbonate IV Soln 8.4%: INTRAVENOUS | Qty: 100 | Status: AC

## 2018-07-07 MED FILL — Heparin Sodium (Porcine) Inj 1000 Unit/ML: INTRAMUSCULAR | Qty: 10 | Status: AC

## 2018-07-08 DIAGNOSIS — Z48812 Encounter for surgical aftercare following surgery on the circulatory system: Secondary | ICD-10-CM | POA: Diagnosis not present

## 2018-07-08 DIAGNOSIS — I214 Non-ST elevation (NSTEMI) myocardial infarction: Secondary | ICD-10-CM | POA: Diagnosis not present

## 2018-07-08 DIAGNOSIS — K219 Gastro-esophageal reflux disease without esophagitis: Secondary | ICD-10-CM | POA: Diagnosis not present

## 2018-07-08 DIAGNOSIS — D631 Anemia in chronic kidney disease: Secondary | ICD-10-CM | POA: Diagnosis not present

## 2018-07-08 DIAGNOSIS — I2510 Atherosclerotic heart disease of native coronary artery without angina pectoris: Secondary | ICD-10-CM | POA: Diagnosis not present

## 2018-07-08 DIAGNOSIS — N183 Chronic kidney disease, stage 3 (moderate): Secondary | ICD-10-CM | POA: Diagnosis not present

## 2018-07-09 DIAGNOSIS — D631 Anemia in chronic kidney disease: Secondary | ICD-10-CM | POA: Diagnosis not present

## 2018-07-09 DIAGNOSIS — I2510 Atherosclerotic heart disease of native coronary artery without angina pectoris: Secondary | ICD-10-CM | POA: Diagnosis not present

## 2018-07-09 DIAGNOSIS — D62 Acute posthemorrhagic anemia: Secondary | ICD-10-CM | POA: Diagnosis not present

## 2018-07-09 DIAGNOSIS — N183 Chronic kidney disease, stage 3 (moderate): Secondary | ICD-10-CM | POA: Diagnosis not present

## 2018-07-09 DIAGNOSIS — Z48812 Encounter for surgical aftercare following surgery on the circulatory system: Secondary | ICD-10-CM | POA: Diagnosis not present

## 2018-07-09 DIAGNOSIS — K219 Gastro-esophageal reflux disease without esophagitis: Secondary | ICD-10-CM | POA: Diagnosis not present

## 2018-07-09 DIAGNOSIS — I214 Non-ST elevation (NSTEMI) myocardial infarction: Secondary | ICD-10-CM | POA: Diagnosis not present

## 2018-07-13 DIAGNOSIS — Z48812 Encounter for surgical aftercare following surgery on the circulatory system: Secondary | ICD-10-CM | POA: Diagnosis not present

## 2018-07-13 DIAGNOSIS — D631 Anemia in chronic kidney disease: Secondary | ICD-10-CM | POA: Diagnosis not present

## 2018-07-13 DIAGNOSIS — I214 Non-ST elevation (NSTEMI) myocardial infarction: Secondary | ICD-10-CM | POA: Diagnosis not present

## 2018-07-13 DIAGNOSIS — N183 Chronic kidney disease, stage 3 (moderate): Secondary | ICD-10-CM | POA: Diagnosis not present

## 2018-07-13 DIAGNOSIS — K219 Gastro-esophageal reflux disease without esophagitis: Secondary | ICD-10-CM | POA: Diagnosis not present

## 2018-07-13 DIAGNOSIS — I2510 Atherosclerotic heart disease of native coronary artery without angina pectoris: Secondary | ICD-10-CM | POA: Diagnosis not present

## 2018-07-15 DIAGNOSIS — E1122 Type 2 diabetes mellitus with diabetic chronic kidney disease: Secondary | ICD-10-CM | POA: Diagnosis not present

## 2018-07-15 DIAGNOSIS — I25700 Atherosclerosis of coronary artery bypass graft(s), unspecified, with unstable angina pectoris: Secondary | ICD-10-CM | POA: Diagnosis not present

## 2018-07-15 DIAGNOSIS — E7800 Pure hypercholesterolemia, unspecified: Secondary | ICD-10-CM | POA: Diagnosis not present

## 2018-07-15 DIAGNOSIS — D631 Anemia in chronic kidney disease: Secondary | ICD-10-CM | POA: Diagnosis not present

## 2018-07-15 DIAGNOSIS — I2510 Atherosclerotic heart disease of native coronary artery without angina pectoris: Secondary | ICD-10-CM | POA: Diagnosis not present

## 2018-07-15 DIAGNOSIS — I214 Non-ST elevation (NSTEMI) myocardial infarction: Secondary | ICD-10-CM | POA: Diagnosis not present

## 2018-07-15 DIAGNOSIS — N183 Chronic kidney disease, stage 3 (moderate): Secondary | ICD-10-CM | POA: Diagnosis not present

## 2018-07-15 DIAGNOSIS — Z6835 Body mass index (BMI) 35.0-35.9, adult: Secondary | ICD-10-CM | POA: Diagnosis not present

## 2018-07-15 DIAGNOSIS — K219 Gastro-esophageal reflux disease without esophagitis: Secondary | ICD-10-CM | POA: Diagnosis not present

## 2018-07-15 DIAGNOSIS — Z299 Encounter for prophylactic measures, unspecified: Secondary | ICD-10-CM | POA: Diagnosis not present

## 2018-07-15 DIAGNOSIS — Z48812 Encounter for surgical aftercare following surgery on the circulatory system: Secondary | ICD-10-CM | POA: Diagnosis not present

## 2018-07-16 ENCOUNTER — Telehealth: Payer: Self-pay | Admitting: Adult Health

## 2018-07-16 NOTE — Telephone Encounter (Signed)
[place], no smartphone, consent, pre reg complete 07/16/18 AF

## 2018-07-17 DIAGNOSIS — Z48812 Encounter for surgical aftercare following surgery on the circulatory system: Secondary | ICD-10-CM | POA: Diagnosis not present

## 2018-07-17 DIAGNOSIS — I214 Non-ST elevation (NSTEMI) myocardial infarction: Secondary | ICD-10-CM | POA: Diagnosis not present

## 2018-07-17 DIAGNOSIS — D631 Anemia in chronic kidney disease: Secondary | ICD-10-CM | POA: Diagnosis not present

## 2018-07-17 DIAGNOSIS — N183 Chronic kidney disease, stage 3 (moderate): Secondary | ICD-10-CM | POA: Diagnosis not present

## 2018-07-17 DIAGNOSIS — I2510 Atherosclerotic heart disease of native coronary artery without angina pectoris: Secondary | ICD-10-CM | POA: Diagnosis not present

## 2018-07-17 DIAGNOSIS — K219 Gastro-esophageal reflux disease without esophagitis: Secondary | ICD-10-CM | POA: Diagnosis not present

## 2018-07-20 DIAGNOSIS — K219 Gastro-esophageal reflux disease without esophagitis: Secondary | ICD-10-CM | POA: Diagnosis not present

## 2018-07-20 DIAGNOSIS — Z48812 Encounter for surgical aftercare following surgery on the circulatory system: Secondary | ICD-10-CM | POA: Diagnosis not present

## 2018-07-20 DIAGNOSIS — I2510 Atherosclerotic heart disease of native coronary artery without angina pectoris: Secondary | ICD-10-CM | POA: Diagnosis not present

## 2018-07-20 DIAGNOSIS — D631 Anemia in chronic kidney disease: Secondary | ICD-10-CM | POA: Diagnosis not present

## 2018-07-20 DIAGNOSIS — N183 Chronic kidney disease, stage 3 (moderate): Secondary | ICD-10-CM | POA: Diagnosis not present

## 2018-07-20 DIAGNOSIS — I214 Non-ST elevation (NSTEMI) myocardial infarction: Secondary | ICD-10-CM | POA: Diagnosis not present

## 2018-07-21 DIAGNOSIS — I2510 Atherosclerotic heart disease of native coronary artery without angina pectoris: Secondary | ICD-10-CM | POA: Diagnosis not present

## 2018-07-21 DIAGNOSIS — N183 Chronic kidney disease, stage 3 (moderate): Secondary | ICD-10-CM | POA: Diagnosis not present

## 2018-07-21 DIAGNOSIS — K219 Gastro-esophageal reflux disease without esophagitis: Secondary | ICD-10-CM | POA: Diagnosis not present

## 2018-07-21 DIAGNOSIS — Z48812 Encounter for surgical aftercare following surgery on the circulatory system: Secondary | ICD-10-CM | POA: Diagnosis not present

## 2018-07-21 DIAGNOSIS — I214 Non-ST elevation (NSTEMI) myocardial infarction: Secondary | ICD-10-CM | POA: Diagnosis not present

## 2018-07-21 DIAGNOSIS — D631 Anemia in chronic kidney disease: Secondary | ICD-10-CM | POA: Diagnosis not present

## 2018-07-22 ENCOUNTER — Telehealth (INDEPENDENT_AMBULATORY_CARE_PROVIDER_SITE_OTHER): Payer: Medicare Other | Admitting: Adult Health

## 2018-07-22 ENCOUNTER — Encounter: Payer: Self-pay | Admitting: Adult Health

## 2018-07-22 VITALS — BP 105/62 | HR 80 | Ht 67.0 in | Wt 225.8 lb

## 2018-07-22 DIAGNOSIS — E7800 Pure hypercholesterolemia, unspecified: Secondary | ICD-10-CM | POA: Diagnosis not present

## 2018-07-22 DIAGNOSIS — I2510 Atherosclerotic heart disease of native coronary artery without angina pectoris: Secondary | ICD-10-CM | POA: Diagnosis not present

## 2018-07-22 DIAGNOSIS — I200 Unstable angina: Secondary | ICD-10-CM | POA: Diagnosis not present

## 2018-07-22 DIAGNOSIS — Z951 Presence of aortocoronary bypass graft: Secondary | ICD-10-CM | POA: Diagnosis not present

## 2018-07-22 DIAGNOSIS — J40 Bronchitis, not specified as acute or chronic: Secondary | ICD-10-CM

## 2018-07-22 DIAGNOSIS — J9589 Other postprocedural complications and disorders of respiratory system, not elsewhere classified: Secondary | ICD-10-CM

## 2018-07-22 MED ORDER — FUROSEMIDE 40 MG PO TABS: 40.0000 mg | ORAL_TABLET | Freq: Every day | ORAL | 1 refills | Status: DC

## 2018-07-22 MED ORDER — TRAMADOL HCL 50 MG PO TABS: 50.0000 mg | ORAL_TABLET | Freq: Two times a day (BID) | ORAL | 0 refills | Status: AC | PRN

## 2018-07-22 NOTE — Progress Notes (Signed)
Virtual Visit via Telephone Note   This visit type was conducted due to national recommendations for restrictions regarding the COVID-19 Pandemic (e.g. social distancing) in an effort to limit this patient's exposure and mitigate transmission in our community.  Due to his co-morbid illnesses, this patient is at least at moderate risk for complications without adequate follow up.  This format is felt to be most appropriate for this patient at this time.  The patient did not have access to video technology/had technical difficulties with video requiring transitioning to audio format only (telephone).  All issues noted in this document were discussed and addressed.  No physical exam could be performed with this format.  Please refer to the patient's chart for his  consent to telehealth for [facility].   Date:  07/22/2018   ID:  HOSEY BURMESTER, DOB 02/26/1925, MRN [id number]  Patient Location: Home Provider Location: Office  PCP:  Monico Blitz, MD  Cardiologist: Dr.Jordan Electrophysiologist:  None   Evaluation Performed:  Follow-Up Visit  Chief Complaint:  Post hospital John Palmer s/p CABG.   History of Present Illness:    John Palmer is a 85 y.o. male with we are following posthospitalization after admission for unstable angina, he presented to [facility] him ED for evaluation and was found to have ST depression in inferior leads and V3 through V6 and mild ST elevation in aVR.  He ultimately had cardiac catheterization which revealed severe diffuse coronary artery disease with a subtotally occluded ostial to proximal RCA, filling antegrade and retrograde by collaterals from the left.  There was a 50% ostial left main stenosis, the proximal to mid LAD had a 90% stenosis, the diagonal branch had 80% stenosis, the LAD beyond the diagonal was totally occluded and filled by left to left collaterals.  His LVEDP was 25.  Echo revealed normal LV systolic function between 60% and 65% with moderate  MR. His COVID test was negative.  As result of the catheterization findings and given that this 85 year old man is active and independent and in fairly good health otherwise it was felt that he was a candidate to have coronary artery bypass grafting, as none of his coronary artery stenosis would be amendable to PCI.  The patient underwent CABG x4 on 06/29/2018, with SVG to LAD, SVG to diagonal, SVG to OM, SVG to PDA with endoscopic vein harvest from the right leg on 06/29/2018.  Postoperative course was complicated by ABL anemia and chronic kidney disease worsening.  He was carefully diuresis due to volume overload and discharged on home Lasix 40 mg daily.  Home health and physical therapy was arranged.  He was to have a follow-up BMET on Thursday, June 11.  He initially wore oxygen for several days postoperatively, but on discharge he was released on room air.  Simvastatin was discontinued and he was started on atorvastatin 80 mg daily.  For anemia he was started on ferrous sulfate 325 mg daily.   I am speaking with his son who is with him today via telephone, as the patient has trouble hearing over the phone.  They are concerned about him having more coughing and congestion with productive cough of yellow and thick white phlegm.  He is coughing anywhere from 20-30 times a day and also throughout the night.  He denies fever, chills, but he has some expiratory wheezing noted.  He has been taking his medications daily and is compliant with them.  He denies hemoptysis.  He states that all  this coughing is causing him increased pain in his chest around his incision sites.  He denies any significant lower extremity edema with the exception of the saphenous vein harvest site leg.  The incision site according to his son is healing well there is no evidence of bleeding or signs of infection.  He still has sutures at the chest tube insertion site.  He is due to follow-up with cardiothoracic surgeon on 08/05/2018.     The  patient does not have symptoms concerning for COVID-19 infection (fever, chills, cough, or new shortness of breath).    Past Medical History:  Diagnosis Date  . GERD (gastroesophageal reflux disease)   . Hypercholesterolemia    Past Surgical History:  Procedure Laterality Date  . "stomach wrap"    . BLADDER SURGERY    . CORONARY ARTERY BYPASS GRAFT N/A 06/29/2018   Procedure: CORONARY ARTERY BYPASS GRAFTING (CABG)TIMES FOUR USING THE RIGHT GREATER SAPHENOUS VEIN, HARVESTED ENDOSCOPICALLY;  Surgeon: Gaye Pollack, MD;  Location: [facility];  Service: Open Heart Surgery;  Laterality: N/A;  . HEMORRHOID SURGERY    . LEFT HEART CATH AND CORONARY ANGIOGRAPHY N/A 06/26/2018   Procedure: LEFT HEART CATH AND CORONARY ANGIOGRAPHY;  Surgeon: Belva Crome, MD;  Location: [facility];  Service: Cardiovascular;  Laterality: N/A;  . TEE WITHOUT CARDIOVERSION N/A 06/29/2018   Procedure: Transesophageal Echocardiogram (Tee);  Surgeon: Gaye Pollack, MD;  Location: [facility];  Service: Open Heart Surgery;  Laterality: N/A;     Current Meds  Medication Sig  . aspirin EC 325 MG EC tablet Take 1 tablet (325 mg total) by mouth daily.  Marland Kitchen atorvastatin (LIPITOR) 80 MG tablet Take 1 tablet (80 mg total) by mouth daily at 6 PM.  . Cholecalciferol (VITAMIN D3) 125 MCG (5000 UT) CAPS Take 5,000 Units by mouth daily.  Marland Kitchen co-enzyme Q-10 30 MG capsule Take 30 mg by mouth daily.  . ferrous sulfate 325 (65 FE) MG tablet Take 1 tablet (325 mg total) by mouth daily. For one month then stop. If develops constipation, may stop sooner. Resume multi vitamin once done taking Ferrous sulfate.  . furosemide (LASIX) 40 MG tablet Take 1 tablet (40 mg total) by mouth daily.  . Omega-3 1000 MG CAPS Take 2 g by mouth daily.  Marland Kitchen rOPINIRole (REQUIP) 2 MG tablet Take 2-4 mg by mouth See admin instructions. Takes 2 mg in the morning and 4 mg in the evening  . saw palmetto 160 MG capsule Take 160 mg by mouth daily.  . traMADol (ULTRAM)  50 MG tablet Take 1 tablet (50 mg total) by mouth every 12 (twelve) hours as needed for moderate pain.  . traZODone (DESYREL) 100 MG tablet Take 100 mg by mouth at bedtime.  . vitamin B-12 (CYANOCOBALAMIN) 100 MCG tablet Take 100 mcg by mouth daily.     Allergies:   Levofloxacin   Social History   Tobacco Use  . Smoking status: Never Smoker  . Smokeless tobacco: Never Used  Substance Use Topics  . Alcohol use: Yes  . Drug use: Never     Family Hx: The patient's family history is not on file.  ROS:   Please see the history of present illness.    All other systems reviewed and are negative.   Prior CV studies:   The following studies were reviewed today:  CABG 06/29/2018 Procedure (s):  1. Median Sternotomy 2. Extracorporeal circulation 3. Coronary artery bypass grafting x 4   Saphenous  veingraft to the LAD  Saphenous vein graft to diagonal  Saphenous vein graft to OM  Saphenous vein graft to PDA 4. Endoscopic vein harvest from the rigntleg 5. Insertion of left femoral arterial line by Dr. Cyndia Bent on 06/29/2018.  Cardiac Cath 07/27/2018  Severe, calcified, coronary artery disease with subtotally occluded ostial to proximal RCA within a "[place]".  RCA fills by left-to-right collaterals and also antegrade.  50% left main.  Proximal to mid 90% LAD.  The significant diagonal contains 80% stenosis.  The LAD beyond the diagonal is totally occluded and fills by left to left collaterals.  2 obtuse marginals arise from the circumflex and are the sources of collaterals to the right coronary and LAD.  Elevated LVEDP, 25 mmHg.  RECOMMENDATIONS:   No interventional options exist.  Discussed with treating team, Dr. Martinique.    Management options would include medical therapy versus high risk CABG.   Echocardiogram 06/26/2018 1. The left ventricle has normal systolic function with an ejection fraction of 60-65%. The cavity size was normal. Left  ventricular diastolic Doppler parameters are consistent with pseudonormalization.  2. The right ventricle has low normal systolic function. The cavity was mildly enlarged. There is no increase in right ventricular wall thickness.  3. Left atrial size was mildly dilated.  4. Right atrial size was mildly dilated.  5. Mitral valve regurgitation is mild to moderate by color flow Doppler.  6. Moderate thickening of the aortic valve. Moderate calcification of the aortic valve. Aortic valve regurgitation is mild by color flow Doppler. Moderate aortic annular calcification noted.  7. The tricuspid valve is not well visualized. Tricuspid valve regurgitation was not assessed by color flow Doppler.  8. The aortic root and ascending aorta are normal in size and structure.  9. The interatrial septum was not assessed.  Labs/Other Tests and Data Reviewed:    EKG:  No ECG reviewed.  Recent Labs: 06/30/2018: Magnesium 2.4 07/03/2018: Hemoglobin 8.5; Platelets 209 07/06/2018: BUN 30; Creatinine, Ser 2.05; Potassium 3.7; Sodium 137   Recent Lipid Panel Lab Results  Component Value Date/Time   CHOL 164 06/26/2018 11:06 AM   TRIG 118 06/26/2018 11:06 AM   HDL 51 06/26/2018 11:06 AM   CHOLHDL 3.2 06/26/2018 11:06 AM   LDLCALC 89 06/26/2018 11:06 AM    Wt Readings from Last 3 Encounters:  07/22/18 225 lb 12.8 oz (102.4 kg)  07/06/18 225 lb 8.5 oz (102.3 kg)     Objective:    Vital Signs:  BP 105/62   Pulse 80   Ht 5\' 7"  (1.702 m)   Wt 225 lb 12.8 oz (102.4 kg)   BMI 35.37 kg/m    VITAL SIGNS:  reviewed GEN:  no acute distress RESPIRATORY:  Frquent coughing. Not able to hear wheezing over the phone.  NEURO:  alert and oriented x 3, no obvious focal deficit PSYCH:  normal affect  ASSESSMENT & PLAN:    1. Coronary artery disease: Status post four-vessel coronary artery bypass grafting.  The patient continues to have a lot of cough and congestion which is causing him to have significant pain in his  chest around his sternotomy site.  I have advised his son who is speaking with me to assist the patient to call his primary care physician, Dr. Manuella Ghazi, to see if he can come into the office and give him a sputum sample and get a chest x-ray to make sure that he does not have infection started with his symptoms of substantial  amount of coughing, chest congestion, and wheezing.  They verbalized understanding and will call Dr. Manuella Ghazi today.  In the interim, they may use Mucinex DM 1200 mg twice daily for symptomatic relief until being seen by primary.  He is due to follow-up with CVTS on August 05, 2018 for postsurgical follow-up.  Due to significant pain from frequent coughing I am giving him a refill on his tramadol 50 mg which she can take 3 times daily for recurrent pain.  No further refills will be provided.  I have explained that to his son who verbalizes understanding.  2.  Cough and congestion: Uncertain if he has some bronchitis or a nosocomial infection from intubation and hospitalization.  During his admission his COVID-19 test was negative.  He denies having fever or chills with his cough and congestion.  Instructions as listed above.  3. Hypercholesterolemia: Continue atorvastatin 80 mg daily with follow-up labs in 3 months.  Goal of LDL less than 70.  COVID-19 Education: The signs and symptoms of COVID-19 were discussed with the patient and how to seek care for testing (follow up with PCP or arrange E-visit).  The importance of social distancing was discussed today.  Time:   Today, I have spent 20 minutes with the patient with telehealth technology discussing the above problems.     Medication Adjustments/Labs and Tests Ordered: Current medicines are reviewed at length with the patient today.  Concerns regarding medicines are outlined above.   Tests Ordered: No orders of the defined types were placed in this encounter.   Medication Changes: No orders of the defined types were placed in  this encounter.   Disposition:  Follow up 1 month and office visit for full assessment.  Signed, Phill Myron. [place] Pugh, ANP, AACC  07/22/2018 1:42 PM    [facility]

## 2018-07-22 NOTE — Addendum Note (Signed)
Addended by: Waylan Rocher on: 07/22/2018 02:04 PM   Modules accepted: Orders

## 2018-07-22 NOTE — Patient Instructions (Addendum)
Medication Instructions:  NO CHANGES- Your physician recommends that you continue on your current medications as directed. Please refer to the Current Medication list given to you today. If you need a refill on your cardiac medications before your next appointment, please call your pharmacy.  Special Instructions: CALL PCP FOR TRAMADOL RX AND APPOINTMENT FOR COUGH AND CONGESTION  MAY TAKE MUCINEX DM FOR COUGH AND CONGESTION.   Follow-Up: You will need a follow up appointment in 1 months   Jory Sims, [place], [place].    You may see Peter Martinique, MD  , Jory Sims, DNP, AACC or one of the following Advanced Practice Providers on your designated Care Team:  Almyra Deforest, [place]  Fabian Sharp, PA-C      At [facility], you and your health needs are our priority.  As part of our continuing mission to provide you with exceptional heart care, we have created designated Provider Care Teams.  These Care Teams include your primary Cardiologist (physician) and Advanced Practice Providers (APPs -  Physician Assistants and Nurse Practitioners) who all work together to provide you with the care you need, when you need it.  Thank you for choosing [facility] at [facility]!!    .MYA

## 2018-07-23 DIAGNOSIS — R0603 Acute respiratory distress: Secondary | ICD-10-CM | POA: Diagnosis not present

## 2018-07-23 DIAGNOSIS — J219 Acute bronchiolitis, unspecified: Secondary | ICD-10-CM | POA: Diagnosis not present

## 2018-07-23 DIAGNOSIS — R05 Cough: Secondary | ICD-10-CM | POA: Diagnosis not present

## 2018-07-23 DIAGNOSIS — G4733 Obstructive sleep apnea (adult) (pediatric): Secondary | ICD-10-CM | POA: Diagnosis not present

## 2018-07-24 DIAGNOSIS — N183 Chronic kidney disease, stage 3 (moderate): Secondary | ICD-10-CM | POA: Diagnosis not present

## 2018-07-24 DIAGNOSIS — Z48812 Encounter for surgical aftercare following surgery on the circulatory system: Secondary | ICD-10-CM | POA: Diagnosis not present

## 2018-07-24 DIAGNOSIS — K219 Gastro-esophageal reflux disease without esophagitis: Secondary | ICD-10-CM | POA: Diagnosis not present

## 2018-07-24 DIAGNOSIS — D631 Anemia in chronic kidney disease: Secondary | ICD-10-CM | POA: Diagnosis not present

## 2018-07-24 DIAGNOSIS — I2510 Atherosclerotic heart disease of native coronary artery without angina pectoris: Secondary | ICD-10-CM | POA: Diagnosis not present

## 2018-07-24 DIAGNOSIS — I214 Non-ST elevation (NSTEMI) myocardial infarction: Secondary | ICD-10-CM | POA: Diagnosis not present

## 2018-07-27 DIAGNOSIS — I214 Non-ST elevation (NSTEMI) myocardial infarction: Secondary | ICD-10-CM | POA: Diagnosis not present

## 2018-07-27 DIAGNOSIS — I2510 Atherosclerotic heart disease of native coronary artery without angina pectoris: Secondary | ICD-10-CM | POA: Diagnosis not present

## 2018-07-27 DIAGNOSIS — N183 Chronic kidney disease, stage 3 (moderate): Secondary | ICD-10-CM | POA: Diagnosis not present

## 2018-07-27 DIAGNOSIS — Z48812 Encounter for surgical aftercare following surgery on the circulatory system: Secondary | ICD-10-CM | POA: Diagnosis not present

## 2018-07-27 DIAGNOSIS — D631 Anemia in chronic kidney disease: Secondary | ICD-10-CM | POA: Diagnosis not present

## 2018-07-27 DIAGNOSIS — K219 Gastro-esophageal reflux disease without esophagitis: Secondary | ICD-10-CM | POA: Diagnosis not present

## 2018-07-28 DIAGNOSIS — H903 Sensorineural hearing loss, bilateral: Secondary | ICD-10-CM | POA: Diagnosis not present

## 2018-07-29 DIAGNOSIS — N183 Chronic kidney disease, stage 3 (moderate): Secondary | ICD-10-CM | POA: Diagnosis not present

## 2018-07-29 DIAGNOSIS — Z48812 Encounter for surgical aftercare following surgery on the circulatory system: Secondary | ICD-10-CM | POA: Diagnosis not present

## 2018-07-29 DIAGNOSIS — I2510 Atherosclerotic heart disease of native coronary artery without angina pectoris: Secondary | ICD-10-CM | POA: Diagnosis not present

## 2018-07-29 DIAGNOSIS — D631 Anemia in chronic kidney disease: Secondary | ICD-10-CM | POA: Diagnosis not present

## 2018-07-29 DIAGNOSIS — K219 Gastro-esophageal reflux disease without esophagitis: Secondary | ICD-10-CM | POA: Diagnosis not present

## 2018-07-29 DIAGNOSIS — I214 Non-ST elevation (NSTEMI) myocardial infarction: Secondary | ICD-10-CM | POA: Diagnosis not present

## 2018-08-03 ENCOUNTER — Other Ambulatory Visit: Payer: Self-pay | Admitting: Surgery

## 2018-08-03 ENCOUNTER — Other Ambulatory Visit: Payer: Self-pay

## 2018-08-03 ENCOUNTER — Ambulatory Visit: Payer: No Typology Code available for payment source

## 2018-08-03 DIAGNOSIS — I2510 Atherosclerotic heart disease of native coronary artery without angina pectoris: Secondary | ICD-10-CM

## 2018-08-04 ENCOUNTER — Ambulatory Visit
Admission: RE | Admit: 2018-08-04 | Discharge: 2018-08-04 | Disposition: A | Discharge status: Home / Self Care | Payer: Medicare Other | Source: Ambulatory Visit | Attending: Surgery | Admitting: Surgery

## 2018-08-04 ENCOUNTER — Ambulatory Visit: Payer: No Typology Code available for payment source

## 2018-08-04 ENCOUNTER — Ambulatory Visit (INDEPENDENT_AMBULATORY_CARE_PROVIDER_SITE_OTHER): Payer: Self-pay | Admitting: Physician Assistant

## 2018-08-04 VITALS — BP 142/77 | HR 93 | Temp 97.7°F | Resp 20 | Ht 67.0 in | Wt 214.0 lb

## 2018-08-04 DIAGNOSIS — Z951 Presence of aortocoronary bypass graft: Secondary | ICD-10-CM

## 2018-08-04 DIAGNOSIS — I2510 Atherosclerotic heart disease of native coronary artery without angina pectoris: Secondary | ICD-10-CM

## 2018-08-04 DIAGNOSIS — J929 Pleural plaque without asbestos: Secondary | ICD-10-CM | POA: Diagnosis not present

## 2018-08-04 NOTE — Patient Instructions (Signed)

## 2018-08-04 NOTE — Progress Notes (Signed)
HPI:  Patient returns for routine postoperative follow-up having undergone CABG on 07/06/2018. The patient's early postoperative recovery while in the hospital progressed without difficulty. Since hospital discharge the patient reports he is doing well.  He is ambulating independently.  His incisions have healed without evidence of infection.  His wife states she has had hard time keeping him in the house and not out and about doing things.  He has had minimal LE swelling.  His cough has improved significantly from his cardiology appointment.  He does not want to proceed with cardiac rehab.   Current Outpatient Medications  Medication Sig Dispense Refill  . aspirin EC 325 MG EC tablet Take 1 tablet (325 mg total) by mouth daily. 30 tablet 0  . atorvastatin (LIPITOR) 80 MG tablet Take 1 tablet (80 mg total) by mouth daily at 6 PM. 30 tablet 1  . Cholecalciferol (VITAMIN D3) 125 MCG (5000 UT) CAPS Take 5,000 Units by mouth daily.    Marland Kitchen co-enzyme Q-10 30 MG capsule Take 30 mg by mouth daily.    . ferrous sulfate 325 (65 FE) MG tablet Take 1 tablet (325 mg total) by mouth daily. For one month then stop. If develops constipation, may stop sooner. Resume multi vitamin once done taking Ferrous sulfate. 30 tablet 3  . furosemide (LASIX) 40 MG tablet Take 1 tablet (40 mg total) by mouth daily. 30 tablet 1  . Omega-3 1000 MG CAPS Take 2 g by mouth daily.    Marland Kitchen rOPINIRole (REQUIP) 2 MG tablet Take 2-4 mg by mouth See admin instructions. Takes 2 mg in the morning and 4 mg in the evening    . saw palmetto 160 MG capsule Take 160 mg by mouth daily.    . traMADol (ULTRAM) 50 MG tablet Take 1 tablet (50 mg total) by mouth every 12 (twelve) hours as needed for moderate pain. Must get refills with PCP 30 tablet 0  . traZODone (DESYREL) 100 MG tablet Take 100 mg by mouth at bedtime.    . vitamin B-12 (CYANOCOBALAMIN) 100 MCG tablet Take 100 mcg by mouth daily.     No current facility-administered medications for this  visit.     Physical Exam:  BP (!) 142/77   Pulse 93   Temp 97.7 F (36.5 C) (Skin)   Resp 20   Ht 5\' 7"  (1.702 m)   Wt 214 lb (97.1 kg)   SpO2 95% Comment: RA  BMI 33.52 kg/m   Gen: no apparent distress Heart: RRR Lungs: CTA bilaterally Ext: no edema Incisions: well healed, chest tube sutures in place  Diagnostic Tests:  CXR: mild atelectasis, no pleural effusions, pneumothorax  A/P:  1. S/P CABG doing very well, mildly elevate BP today, will defer to Cardiology if elevated next visit may need to start Lopressor 2. Chest tube sutures removed 3. Activity- increase as tolerated, may drive, continue hearty healthy diet 4. RTC prn  Ellwood Handler, PA-C [facility] [phone]

## 2018-08-05 ENCOUNTER — Ambulatory Visit: Payer: No Typology Code available for payment source | Admitting: Surgery

## 2018-08-05 DIAGNOSIS — K219 Gastro-esophageal reflux disease without esophagitis: Secondary | ICD-10-CM | POA: Diagnosis not present

## 2018-08-05 DIAGNOSIS — Z299 Encounter for prophylactic measures, unspecified: Secondary | ICD-10-CM | POA: Diagnosis not present

## 2018-08-05 DIAGNOSIS — J449 Chronic obstructive pulmonary disease, unspecified: Secondary | ICD-10-CM | POA: Diagnosis not present

## 2018-08-05 DIAGNOSIS — I25700 Atherosclerosis of coronary artery bypass graft(s), unspecified, with unstable angina pectoris: Secondary | ICD-10-CM | POA: Diagnosis not present

## 2018-08-05 DIAGNOSIS — Z48812 Encounter for surgical aftercare following surgery on the circulatory system: Secondary | ICD-10-CM | POA: Diagnosis not present

## 2018-08-05 DIAGNOSIS — E1165 Type 2 diabetes mellitus with hyperglycemia: Secondary | ICD-10-CM | POA: Diagnosis not present

## 2018-08-05 DIAGNOSIS — D631 Anemia in chronic kidney disease: Secondary | ICD-10-CM | POA: Diagnosis not present

## 2018-08-05 DIAGNOSIS — I2510 Atherosclerotic heart disease of native coronary artery without angina pectoris: Secondary | ICD-10-CM | POA: Diagnosis not present

## 2018-08-05 DIAGNOSIS — I214 Non-ST elevation (NSTEMI) myocardial infarction: Secondary | ICD-10-CM | POA: Diagnosis not present

## 2018-08-05 DIAGNOSIS — N183 Chronic kidney disease, stage 3 (moderate): Secondary | ICD-10-CM | POA: Diagnosis not present

## 2018-08-05 DIAGNOSIS — Z6835 Body mass index (BMI) 35.0-35.9, adult: Secondary | ICD-10-CM | POA: Diagnosis not present

## 2018-08-06 DIAGNOSIS — I2510 Atherosclerotic heart disease of native coronary artery without angina pectoris: Secondary | ICD-10-CM | POA: Diagnosis not present

## 2018-08-06 DIAGNOSIS — F329 Major depressive disorder, single episode, unspecified: Secondary | ICD-10-CM | POA: Diagnosis not present

## 2018-08-06 DIAGNOSIS — Z9181 History of falling: Secondary | ICD-10-CM | POA: Diagnosis not present

## 2018-08-06 DIAGNOSIS — Z7982 Long term (current) use of aspirin: Secondary | ICD-10-CM | POA: Diagnosis not present

## 2018-08-06 DIAGNOSIS — Z951 Presence of aortocoronary bypass graft: Secondary | ICD-10-CM | POA: Diagnosis not present

## 2018-08-06 DIAGNOSIS — E785 Hyperlipidemia, unspecified: Secondary | ICD-10-CM | POA: Diagnosis not present

## 2018-08-06 DIAGNOSIS — Z48812 Encounter for surgical aftercare following surgery on the circulatory system: Secondary | ICD-10-CM | POA: Diagnosis not present

## 2018-08-06 DIAGNOSIS — K219 Gastro-esophageal reflux disease without esophagitis: Secondary | ICD-10-CM | POA: Diagnosis not present

## 2018-08-06 DIAGNOSIS — D631 Anemia in chronic kidney disease: Secondary | ICD-10-CM | POA: Diagnosis not present

## 2018-08-06 DIAGNOSIS — I214 Non-ST elevation (NSTEMI) myocardial infarction: Secondary | ICD-10-CM | POA: Diagnosis not present

## 2018-08-06 DIAGNOSIS — N183 Chronic kidney disease, stage 3 (moderate): Secondary | ICD-10-CM | POA: Diagnosis not present

## 2018-08-11 DIAGNOSIS — I2510 Atherosclerotic heart disease of native coronary artery without angina pectoris: Secondary | ICD-10-CM | POA: Diagnosis not present

## 2018-08-11 DIAGNOSIS — Z48812 Encounter for surgical aftercare following surgery on the circulatory system: Secondary | ICD-10-CM | POA: Diagnosis not present

## 2018-08-11 DIAGNOSIS — N183 Chronic kidney disease, stage 3 (moderate): Secondary | ICD-10-CM | POA: Diagnosis not present

## 2018-08-11 DIAGNOSIS — D631 Anemia in chronic kidney disease: Secondary | ICD-10-CM | POA: Diagnosis not present

## 2018-08-11 DIAGNOSIS — I214 Non-ST elevation (NSTEMI) myocardial infarction: Secondary | ICD-10-CM | POA: Diagnosis not present

## 2018-08-11 DIAGNOSIS — K219 Gastro-esophageal reflux disease without esophagitis: Secondary | ICD-10-CM | POA: Diagnosis not present

## 2018-08-14 DIAGNOSIS — I214 Non-ST elevation (NSTEMI) myocardial infarction: Secondary | ICD-10-CM | POA: Diagnosis not present

## 2018-08-14 DIAGNOSIS — N183 Chronic kidney disease, stage 3 (moderate): Secondary | ICD-10-CM | POA: Diagnosis not present

## 2018-08-14 DIAGNOSIS — K219 Gastro-esophageal reflux disease without esophagitis: Secondary | ICD-10-CM | POA: Diagnosis not present

## 2018-08-14 DIAGNOSIS — D631 Anemia in chronic kidney disease: Secondary | ICD-10-CM | POA: Diagnosis not present

## 2018-08-14 DIAGNOSIS — Z48812 Encounter for surgical aftercare following surgery on the circulatory system: Secondary | ICD-10-CM | POA: Diagnosis not present

## 2018-08-14 DIAGNOSIS — I2510 Atherosclerotic heart disease of native coronary artery without angina pectoris: Secondary | ICD-10-CM | POA: Diagnosis not present

## 2018-08-17 DIAGNOSIS — I2510 Atherosclerotic heart disease of native coronary artery without angina pectoris: Secondary | ICD-10-CM | POA: Diagnosis not present

## 2018-08-17 DIAGNOSIS — K219 Gastro-esophageal reflux disease without esophagitis: Secondary | ICD-10-CM | POA: Diagnosis not present

## 2018-08-17 DIAGNOSIS — N183 Chronic kidney disease, stage 3 (moderate): Secondary | ICD-10-CM | POA: Diagnosis not present

## 2018-08-17 DIAGNOSIS — I214 Non-ST elevation (NSTEMI) myocardial infarction: Secondary | ICD-10-CM | POA: Diagnosis not present

## 2018-08-17 DIAGNOSIS — Z48812 Encounter for surgical aftercare following surgery on the circulatory system: Secondary | ICD-10-CM | POA: Diagnosis not present

## 2018-08-17 DIAGNOSIS — D631 Anemia in chronic kidney disease: Secondary | ICD-10-CM | POA: Diagnosis not present

## 2018-08-19 ENCOUNTER — Telehealth: Payer: Self-pay | Admitting: Adult Health

## 2018-08-19 DIAGNOSIS — Z48812 Encounter for surgical aftercare following surgery on the circulatory system: Secondary | ICD-10-CM | POA: Diagnosis not present

## 2018-08-19 DIAGNOSIS — K219 Gastro-esophageal reflux disease without esophagitis: Secondary | ICD-10-CM | POA: Diagnosis not present

## 2018-08-19 DIAGNOSIS — I214 Non-ST elevation (NSTEMI) myocardial infarction: Secondary | ICD-10-CM | POA: Diagnosis not present

## 2018-08-19 DIAGNOSIS — N183 Chronic kidney disease, stage 3 (moderate): Secondary | ICD-10-CM | POA: Diagnosis not present

## 2018-08-19 DIAGNOSIS — D631 Anemia in chronic kidney disease: Secondary | ICD-10-CM | POA: Diagnosis not present

## 2018-08-19 DIAGNOSIS — I2510 Atherosclerotic heart disease of native coronary artery without angina pectoris: Secondary | ICD-10-CM | POA: Diagnosis not present

## 2018-08-19 NOTE — Telephone Encounter (Signed)
I called pt to confirm his appt for 08-20-18.

## 2018-08-20 ENCOUNTER — Encounter: Payer: Self-pay | Admitting: Adult Health

## 2018-08-20 ENCOUNTER — Telehealth (INDEPENDENT_AMBULATORY_CARE_PROVIDER_SITE_OTHER): Payer: Medicare Other | Admitting: Adult Health

## 2018-08-20 VITALS — BP 112/58 | HR 95 | Ht 67.0 in | Wt 244.4 lb

## 2018-08-20 DIAGNOSIS — E785 Hyperlipidemia, unspecified: Secondary | ICD-10-CM

## 2018-08-20 DIAGNOSIS — Z951 Presence of aortocoronary bypass graft: Secondary | ICD-10-CM | POA: Diagnosis not present

## 2018-08-20 DIAGNOSIS — I2510 Atherosclerotic heart disease of native coronary artery without angina pectoris: Secondary | ICD-10-CM | POA: Diagnosis not present

## 2018-08-20 DIAGNOSIS — I200 Unstable angina: Secondary | ICD-10-CM

## 2018-08-20 DIAGNOSIS — Z79899 Other long term (current) drug therapy: Secondary | ICD-10-CM

## 2018-08-20 MED ORDER — ATORVASTATIN CALCIUM 80 MG PO TABS: 80.0000 mg | ORAL_TABLET | Freq: Every day | ORAL | 1 refills | Status: DC

## 2018-08-20 MED ORDER — FUROSEMIDE 40 MG PO TABS: 40.0000 mg | ORAL_TABLET | ORAL | 3 refills | Status: DC

## 2018-08-20 NOTE — Patient Instructions (Signed)
Medication Instructions:  Take- Furosemide 40 mg every other day unless Edema(swelling) in legs or weight gain, then you should go back to daily dose.  If you need a refill on your cardiac medications before your next appointment, please call your pharmacy.  Labwork: Fasting Lipids and Liver, BMP HERE IN OUR OFFICE AT [facility]  You will need to fast. DO NOT EAT OR DRINK PAST MIDNIGHT.     Take the provided lab slips with you to the lab for your blood draw.   When you have your labs (blood work) drawn today and your tests are completely normal, you will receive your results only by MyChart Message (if you have MyChart) -OR-  A paper copy in the mail.  If you have any lab test that is abnormal or we need to change your treatment, we will call you to review these results.  Testing/Procedures: None Ordered  Follow-Up: You will need a follow up appointment in 3 months.  Please call our office 2 months in advance to schedule this appointment.  You may see Peter Martinique, MD or one of the following Advanced Practice Providers on your designated Care Team: [place], [place] . Fabian Sharp, PA-C     At [facility], you and your health needs are our priority.  As part of our continuing mission to provide you with exceptional heart care, we have created designated Provider Care Teams.  These Care Teams include your primary Cardiologist (physician) and Advanced Practice Providers (APPs -  Physician Assistants and Nurse Practitioners) who all work together to provide you with the care you need, when you need it.  Thank you for choosing [facility] at [facility]!!

## 2018-08-20 NOTE — Progress Notes (Signed)
Telehealth Video  Patient has given verbal permission to conduct this visit via virtual appointment and to bill insurance 08/20/2018 11:17 AM     Date:  08/20/2018   ID:  John Palmer, DOB Mar 31, 1925, MRN [id number]  Patient Location: Home Provider Location: Home   PCP:  Monico Blitz, MD  Cardiologist:  Peter Martinique, MD Electrophysiologist:  None   Evaluation Performed:  Follow Up  Chief Complaint: Follow Up CAD  History of Present Illness:    John Palmer is a 85 y.o. male we are following for ongoing assessment and management of coronary artery disease with admission to the hospital in May 2020 for chest pain and abnormal EKG.  Patient had a cardiac catheterization which revealed severe diffuse coronary artery disease with subtotally occluded ostial to proximal RCA filling antegrade and retrograde by collaterals from the left.  He was also found to have a 50% ostial left main stenosis, proximal mid LAD 90% stenosis, the diagonal branch had 80% stenosis the LAD beyond the diagonal was totally occluded and filled by left to left collaterals.  LVEDP was 25.  He was found to have normal LV systolic function of 24% to 65% with moderate MR.  Patient subsequently was referred to CVTS and had coronary artery bypass grafting x4 on 06/29/2018 (SVG to LAD, SVG to diagonal, SVG to OM, SVG to PDA, with endoscopic vein harvest from the right leg on 06/29/2018.  The patient on discharge was sent home on oxygen, physical therapy, and home health.  He was last evaluated via virtual visit with the assistance of his son who is with him to help with answering questions.  At that time he was complaining of considerable coughing and lung congestion.  He was advised to see his PCP for further evaluation.  In the interim he was given instructions to take Mucinex DM 1200 mg twice daily for symptomatic relief until being seen.  He was to follow-up with CVTS on August 15, 2018 for postsurgical evaluation.  On follow-up  visit with CVTS, on 08/04/2018 the patient was seen by Ellwood Handler, PA and had significant improvement in his coughing and breathing status since being evaluated through our office.  His incisions had healed very well.  His energy level had improved significantly and are having trouble keeping him from overdoing as he was feeling so much better.  The patient refused cardiac rehab.  The patient is accompanied by his son today via video visit.  He is doing much better.  He has seen his pulmonologist who started him on steroid Dosepak and inhaled nebulizer treatments.  He is feeling much better breathing better and is become more active.  He has no new complaints today.  The patient does not have symptoms concerning for COVID-19 infection (fever, chills, cough, or new shortness of breath).    Past Medical History:  Diagnosis Date   GERD (gastroesophageal reflux disease)    Hypercholesterolemia    Past Surgical History:  Procedure Laterality Date   "stomach wrap"     BLADDER SURGERY     CORONARY ARTERY BYPASS GRAFT N/A 06/29/2018   Procedure: CORONARY ARTERY BYPASS GRAFTING (CABG)TIMES FOUR USING THE RIGHT GREATER SAPHENOUS VEIN, HARVESTED ENDOSCOPICALLY;  Surgeon: Gaye Pollack, MD;  Location: [place];  Service: Open Heart Surgery;  Laterality: N/A;   HEMORRHOID SURGERY     LEFT HEART CATH AND CORONARY ANGIOGRAPHY N/A 06/26/2018   Procedure: LEFT HEART CATH AND CORONARY ANGIOGRAPHY;  Surgeon: Belva Crome, MD;  Location: [place] CV LAB;  Service: Cardiovascular;  Laterality: N/A;   TEE WITHOUT CARDIOVERSION N/A 06/29/2018   Procedure: Transesophageal Echocardiogram (Tee);  Surgeon: Gaye Pollack, MD;  Location: [place];  Service: Open Heart Surgery;  Laterality: N/A;     Current Meds  Medication Sig   aspirin EC 325 MG EC tablet Take 1 tablet (325 mg total) by mouth daily.   atorvastatin (LIPITOR) 80 MG tablet Take 1 tablet (80 mg total) by mouth daily at 6 PM.   Cholecalciferol  (VITAMIN D3) 125 MCG (5000 UT) CAPS Take 5,000 Units by mouth daily.   co-enzyme Q-10 30 MG capsule Take 30 mg by mouth daily.   furosemide (LASIX) 40 MG tablet Take 1 tablet (40 mg total) by mouth every other day.   Omega-3 1000 MG CAPS Take 2 g by mouth daily.   rOPINIRole (REQUIP) 2 MG tablet Take 2-4 mg by mouth See admin instructions. Takes 2 mg in the morning and 4 mg in the evening   saw palmetto 160 MG capsule Take 160 mg by mouth daily.   traMADol (ULTRAM) 50 MG tablet Take 1 tablet (50 mg total) by mouth every 12 (twelve) hours as needed for moderate pain. Must get refills with PCP   traZODone (DESYREL) 100 MG tablet Take 100 mg by mouth at bedtime.   vitamin B-12 (CYANOCOBALAMIN) 100 MCG tablet Take 100 mcg by mouth daily.   [DISCONTINUED] atorvastatin (LIPITOR) 80 MG tablet Take 1 tablet (80 mg total) by mouth daily at 6 PM.   [DISCONTINUED] furosemide (LASIX) 40 MG tablet Take 1 tablet (40 mg total) by mouth daily.     Allergies:   Levofloxacin   Social History   Tobacco Use   Smoking status: Never Smoker   Smokeless tobacco: Never Used  Substance Use Topics   Alcohol use: Yes   Drug use: Never     Family Hx: The patient's family history is not on file.  ROS:   Please see the history of present illness.    All other systems reviewed and are negative.   Prior CV studies:   The following studies were reviewed today: CABG 06/29/2018 Procedure (s): 1. Median Sternotomy 2. Extracorporeal circulation 3. Coronary artery bypass grafting x 4   Saphenous veingraft to the LAD  Saphenous vein graft to diagonal  Saphenous vein graft to OM  Saphenous vein graft to PDA 4. Endoscopic vein harvest from the rigntleg 5. Insertion of left femoral arterial lineby Dr. Cyndia Bent on 06/29/2018.  Cardiac Cath 06/26/2018  Severe, calcified, coronary artery disease with subtotally occluded ostial to proximal RCA within a "Shepherd's Crook". RCA fills by  left-to-right collaterals and also antegrade.  50% left main.  Proximal to mid 90% LAD. The significant diagonal contains 80% stenosis. The LAD beyond the diagonal is totally occluded and fills by left to left collaterals.  2 obtuse marginals arise from the circumflex and are the sources of collaterals to the right coronary and LAD.  Elevated LVEDP, 25 mmHg.  RECOMMENDATIONS:   No interventional options exist.  Discussed with treating team, Dr. Martinique.   Management options would include medical therapy versus high risk CABG.   Labs/Other Tests and Data Reviewed:    EKG: Not completed today as this is a telephone visit  Recent Labs: 06/30/2018: Magnesium 2.4 07/03/2018: Hemoglobin 8.5; Platelets 209 07/06/2018: BUN 30; Creatinine, Ser 2.05; Potassium 3.7; Sodium 137   Recent Lipid Panel Lab Results  Component Value Date/Time   CHOL 164  06/26/2018 11:06 AM   TRIG 118 06/26/2018 11:06 AM   HDL 51 06/26/2018 11:06 AM   CHOLHDL 3.2 06/26/2018 11:06 AM   LDLCALC 89 06/26/2018 11:06 AM    Wt Readings from Last 3 Encounters:  08/20/18 244 lb 6.4 oz (110.9 kg)  08/04/18 214 lb (97.1 kg)  07/22/18 225 lb 12.8 oz (102.4 kg)     Objective:    Vital Signs:  BP (!) 112/58    Pulse 95    Ht 5\' 7"  (1.702 m)    Wt 244 lb 6.4 oz (110.9 kg)    BMI 38.28 kg/m    Limited due to telephone visit General: Awake alert oriented no acute distress Respiratory: Normal breathing without wheezing coughing or rhonchi Neuro: Extremely hard of hearing Psych: Normal affect and response  ASSESSMENT & PLAN:    1.  Coronary artery disease: History of multivessel disease status post four-vessel coronary artery bypass grafting on 06/29/2018.  Doing well has refused cardiac rehab.  Has a lot more energy and wants to get back out to mowing his lawn and working in his garden.  I have advised him to do this in a stepwise fashion.  He is not to be out in temperatures greater than 85 degrees.  He may use  his riding lawnmower but not push more or weed eater for another 3 months.  The patient is not to lift greater than 20 pounds for another 6 weeks.  No changes in his medication regimen.  2.  Hypercholesterolemia: Remains on atorvastatin 20 mg daily.  Refills are provided.  He will need follow-up lipids and LFTs in 3 months and see Dr. Martinique thereafter.  Goal of LDL less than 70.  3.  GERD: Stable without further complaints at this time.  COVID-19 Education: The signs and symptoms of COVID-19 were discussed with the patient and how to seek care for testing (follow up with PCP or arrange E-visit). The importance of social distancing was discussed today.  Time:   Today, I have spent 15 minutes with the patient with telehealth technology discussing the above problems.     Medication Adjustments/Labs and Tests Ordered: Current medicines are reviewed at length with the patient today.  Concerns regarding medicines are outlined above.   Tests Ordered: Orders Placed This Encounter  Procedures   Lipid panel   Hepatic function panel   Basic Metabolic Panel (BMET)    Medication Changes: Meds ordered this encounter  Medications   atorvastatin (LIPITOR) 80 MG tablet    Sig: Take 1 tablet (80 mg total) by mouth daily at 6 PM.    Dispense:  30 tablet    Refill:  1   furosemide (LASIX) 40 MG tablet    Sig: Take 1 tablet (40 mg total) by mouth every other day.    Dispense:  90 tablet    Refill:  3    Disposition:  Follow up  3 months with Dr. Martinique (virtual or in person)  Signed, Phill Myron. [place] Pugh, ANP, AACC   08/20/2018 11:10 AM    [facility]

## 2018-08-26 DIAGNOSIS — I214 Non-ST elevation (NSTEMI) myocardial infarction: Secondary | ICD-10-CM | POA: Diagnosis not present

## 2018-08-26 DIAGNOSIS — I2510 Atherosclerotic heart disease of native coronary artery without angina pectoris: Secondary | ICD-10-CM | POA: Diagnosis not present

## 2018-08-26 DIAGNOSIS — Z48812 Encounter for surgical aftercare following surgery on the circulatory system: Secondary | ICD-10-CM | POA: Diagnosis not present

## 2018-08-26 DIAGNOSIS — N183 Chronic kidney disease, stage 3 (moderate): Secondary | ICD-10-CM | POA: Diagnosis not present

## 2018-08-26 DIAGNOSIS — K219 Gastro-esophageal reflux disease without esophagitis: Secondary | ICD-10-CM | POA: Diagnosis not present

## 2018-08-26 DIAGNOSIS — D631 Anemia in chronic kidney disease: Secondary | ICD-10-CM | POA: Diagnosis not present

## 2018-08-28 ENCOUNTER — Other Ambulatory Visit: Payer: Self-pay | Admitting: *Deleted

## 2018-08-28 DIAGNOSIS — E785 Hyperlipidemia, unspecified: Secondary | ICD-10-CM | POA: Diagnosis not present

## 2018-08-28 DIAGNOSIS — F329 Major depressive disorder, single episode, unspecified: Secondary | ICD-10-CM | POA: Diagnosis not present

## 2018-08-28 DIAGNOSIS — D631 Anemia in chronic kidney disease: Secondary | ICD-10-CM | POA: Diagnosis not present

## 2018-08-28 DIAGNOSIS — K219 Gastro-esophageal reflux disease without esophagitis: Secondary | ICD-10-CM | POA: Diagnosis not present

## 2018-08-28 MED ORDER — ATORVASTATIN CALCIUM 80 MG PO TABS: 80.0000 mg | ORAL_TABLET | Freq: Every day | ORAL | 3 refills | Status: DC

## 2018-09-07 DIAGNOSIS — J449 Chronic obstructive pulmonary disease, unspecified: Secondary | ICD-10-CM | POA: Diagnosis not present

## 2018-09-07 DIAGNOSIS — M159 Polyosteoarthritis, unspecified: Secondary | ICD-10-CM | POA: Diagnosis not present

## 2018-09-07 DIAGNOSIS — E7800 Pure hypercholesterolemia, unspecified: Secondary | ICD-10-CM | POA: Diagnosis not present

## 2018-09-07 DIAGNOSIS — E119 Type 2 diabetes mellitus without complications: Secondary | ICD-10-CM | POA: Diagnosis not present

## 2018-09-16 DIAGNOSIS — E1165 Type 2 diabetes mellitus with hyperglycemia: Secondary | ICD-10-CM | POA: Diagnosis not present

## 2018-09-16 DIAGNOSIS — E1122 Type 2 diabetes mellitus with diabetic chronic kidney disease: Secondary | ICD-10-CM | POA: Diagnosis not present

## 2018-09-16 DIAGNOSIS — Z299 Encounter for prophylactic measures, unspecified: Secondary | ICD-10-CM | POA: Diagnosis not present

## 2018-09-16 DIAGNOSIS — I25700 Atherosclerosis of coronary artery bypass graft(s), unspecified, with unstable angina pectoris: Secondary | ICD-10-CM | POA: Diagnosis not present

## 2018-09-16 DIAGNOSIS — Z6835 Body mass index (BMI) 35.0-35.9, adult: Secondary | ICD-10-CM | POA: Diagnosis not present

## 2018-09-21 DIAGNOSIS — E785 Hyperlipidemia, unspecified: Secondary | ICD-10-CM | POA: Diagnosis not present

## 2018-09-21 DIAGNOSIS — Z79899 Other long term (current) drug therapy: Secondary | ICD-10-CM | POA: Diagnosis not present

## 2018-09-22 LAB — LIPID PANEL
Chol/HDL Ratio: 3.6 ratio (ref 0.0–5.0)
Cholesterol, Total: 155 mg/dL (ref 100–199)
HDL: 43 mg/dL (ref >39)
LDL Calculated: 83 mg/dL (ref 0–99)
Triglycerides: 143 mg/dL (ref 0–149)
VLDL Cholesterol Cal: 29 mg/dL (ref 5–40)

## 2018-09-22 LAB — HEPATIC FUNCTION PANEL
ALT: 17 IU/L (ref 0–44)
AST: 20 IU/L (ref 0–40)
Albumin: 4.7 g/dL — ABNORMAL HIGH (ref 3.5–4.6)
Alkaline Phosphatase: 114 IU/L (ref 39–117)
Bilirubin Total: 0.3 mg/dL (ref 0.0–1.2)
Bilirubin, Direct: 0.08 mg/dL (ref 0.00–0.40)
Total Protein: 6.7 g/dL (ref 6.0–8.5)

## 2018-09-22 LAB — BASIC METABOLIC PANEL
BUN/Creatinine Ratio: 12 (ref 10–24)
BUN: 23 mg/dL (ref 10–36)
CO2: 25 mmol/L (ref 20–29)
Calcium: 9.8 mg/dL (ref 8.6–10.2)
Chloride: 102 mmol/L (ref 96–106)
Creatinine, Ser: 1.85 mg/dL — ABNORMAL HIGH (ref 0.76–1.27)
GFR calc Af Amer: 36 mL/min/{1.73_m2} — ABNORMAL LOW (ref >59)
GFR calc non Af Amer: 31 mL/min/{1.73_m2} — ABNORMAL LOW (ref >59)
Glucose: 111 mg/dL — ABNORMAL HIGH (ref 65–99)
Potassium: 5.4 mmol/L — ABNORMAL HIGH (ref 3.5–5.2)
Sodium: 142 mmol/L (ref 134–144)

## 2018-09-24 ENCOUNTER — Telehealth: Payer: Self-pay | Admitting: *Deleted

## 2018-09-24 DIAGNOSIS — E785 Hyperlipidemia, unspecified: Secondary | ICD-10-CM

## 2018-09-24 DIAGNOSIS — Z79899 Other long term (current) drug therapy: Secondary | ICD-10-CM

## 2018-09-24 MED ORDER — EZETIMIBE 10 MG PO TABS: 10.0000 mg | ORAL_TABLET | Freq: Every day | ORAL | 3 refills | Status: DC

## 2018-09-24 NOTE — Telephone Encounter (Signed)
Pt aware of his blood work, Zetia 10 mg was send into pt pharmacy, lipid liver ordered and mail to pt.

## 2018-09-24 NOTE — Telephone Encounter (Signed)
-----   Message from Lendon Colonel, NP sent at 09/22/2018 11:02 AM EDT ----- Reviewed labs. LDL is not at goal of <70 on Lipitor 80 and Fish Oil. Please add Zetia 10 mg to his regimen. Follow up lipids and LFT's in 3 months, May need referral to [facility], but will wait until labs are repeated. Thx

## 2018-10-06 DIAGNOSIS — J449 Chronic obstructive pulmonary disease, unspecified: Secondary | ICD-10-CM | POA: Diagnosis not present

## 2018-10-06 DIAGNOSIS — M159 Polyosteoarthritis, unspecified: Secondary | ICD-10-CM | POA: Diagnosis not present

## 2018-10-06 DIAGNOSIS — E7800 Pure hypercholesterolemia, unspecified: Secondary | ICD-10-CM | POA: Diagnosis not present

## 2018-10-06 DIAGNOSIS — E119 Type 2 diabetes mellitus without complications: Secondary | ICD-10-CM | POA: Diagnosis not present

## 2018-10-08 DIAGNOSIS — Z1159 Encounter for screening for other viral diseases: Secondary | ICD-10-CM | POA: Diagnosis not present

## 2018-10-12 DIAGNOSIS — J410 Simple chronic bronchitis: Secondary | ICD-10-CM | POA: Diagnosis not present

## 2018-10-12 DIAGNOSIS — R0603 Acute respiratory distress: Secondary | ICD-10-CM | POA: Diagnosis not present

## 2018-10-12 DIAGNOSIS — R05 Cough: Secondary | ICD-10-CM | POA: Diagnosis not present

## 2018-10-12 DIAGNOSIS — U071 COVID-19: Secondary | ICD-10-CM | POA: Diagnosis not present

## 2018-10-12 MED ORDER — FUROSEMIDE 40 MG PO TABS: 40.0000 mg | ORAL_TABLET | ORAL | 1 refills | Status: DC

## 2018-10-21 DIAGNOSIS — L570 Actinic keratosis: Secondary | ICD-10-CM | POA: Diagnosis not present

## 2018-10-27 DIAGNOSIS — L570 Actinic keratosis: Secondary | ICD-10-CM | POA: Diagnosis not present

## 2018-11-03 DIAGNOSIS — M159 Polyosteoarthritis, unspecified: Secondary | ICD-10-CM | POA: Diagnosis not present

## 2018-11-03 DIAGNOSIS — J449 Chronic obstructive pulmonary disease, unspecified: Secondary | ICD-10-CM | POA: Diagnosis not present

## 2018-11-03 DIAGNOSIS — E7800 Pure hypercholesterolemia, unspecified: Secondary | ICD-10-CM | POA: Diagnosis not present

## 2018-11-03 DIAGNOSIS — E119 Type 2 diabetes mellitus without complications: Secondary | ICD-10-CM | POA: Diagnosis not present

## 2018-11-09 DIAGNOSIS — Z23 Encounter for immunization: Secondary | ICD-10-CM | POA: Diagnosis not present

## 2018-11-11 ENCOUNTER — Encounter: Payer: Self-pay | Admitting: Cardiology

## 2018-11-11 DIAGNOSIS — E1122 Type 2 diabetes mellitus with diabetic chronic kidney disease: Secondary | ICD-10-CM | POA: Diagnosis not present

## 2018-11-12 DIAGNOSIS — I2510 Atherosclerotic heart disease of native coronary artery without angina pectoris: Secondary | ICD-10-CM | POA: Diagnosis not present

## 2018-11-12 DIAGNOSIS — N184 Chronic kidney disease, stage 4 (severe): Secondary | ICD-10-CM | POA: Diagnosis not present

## 2018-11-12 DIAGNOSIS — R9431 Abnormal electrocardiogram [ECG] [EKG]: Secondary | ICD-10-CM | POA: Diagnosis not present

## 2018-11-12 DIAGNOSIS — E785 Hyperlipidemia, unspecified: Secondary | ICD-10-CM | POA: Diagnosis not present

## 2018-11-17 NOTE — Progress Notes (Signed)
Virtual Visit via Telephone Note   This visit type was conducted due to national recommendations for restrictions regarding the COVID-19 Pandemic (e.g. social distancing) in an effort to limit this patient's exposure and mitigate transmission in our community.  Due to his co-morbid illnesses, this patient is at least at moderate risk for complications without adequate follow up.  This format is felt to be most appropriate for this patient at this time.  The patient did not have access to video technology/had technical difficulties with video requiring transitioning to audio format only (telephone).  All issues noted in this document were discussed and addressed.  No physical exam could be performed with this format.  Please refer to the patient's chart for his  consent to telehealth for [facility].   Date:  11/20/2018   ID:  John Palmer, DOB Feb 24, 1925, MRN [id number]  Patient Location: Home Provider Location: Home  PCP:  Monico Blitz, MD  Cardiologist:  Franceska Strahm Martinique, MD  Electrophysiologist:  None   Evaluation Performed:  Follow-Up Visit  Chief Complaint:  CAD  History of Present Illness:    John Palmer is a 85 y.o. male with history of CAD. He was admitted to  the hospital in May 2020 for chest pain and abnormal EKG. Patient had a cardiac catheterization which revealed severe diffuse coronary artery disease with subtotally occluded ostial to proximal RCA filling antegrade and retrograde by collaterals from the left.  He was also found to have a 50% ostial left main stenosis, proximal mid LAD 90% stenosis, the diagonal branch had 80% stenosis the LAD beyond the diagonal was totally occluded and filled by left to left collaterals.  LVEDP was 25.  He was found to have normal LV systolic function of 87% to 65% with moderate MR.  Patient subsequently was referred to CVTS and had coronary artery bypass grafting x4 on 06/29/2018 (SVG to LAD, SVG to diagonal, SVG to OM, SVG to PDA,  with endoscopic vein harvest from the right leg on 06/29/2018.  The patient on discharge was sent home on oxygen, physical therapy, and home health.  Patient has made an excellent recovery from the CABG. He did get Covid 19 associated with HA, congestion, loss of smell. He has recovered well from this. He is walking twice a day without limitation. Wants to get back to doing his own yard work. Family is concerned about his driving. Thinks his reaction time may be slow. He denies any CP, SOB, palpitations. Energy level is good.    The patient does not have symptoms concerning for COVID-19 infection (fever, chills, cough, or new shortness of breath).    Past Medical History:  Diagnosis Date  . GERD (gastroesophageal reflux disease)   . Hypercholesterolemia    Past Surgical History:  Procedure Laterality Date  . "stomach wrap"    . BLADDER SURGERY    . CORONARY ARTERY BYPASS GRAFT N/A 06/29/2018   Procedure: CORONARY ARTERY BYPASS GRAFTING (CABG)TIMES FOUR USING THE RIGHT GREATER SAPHENOUS VEIN, HARVESTED ENDOSCOPICALLY;  Surgeon: Gaye Pollack, MD;  Location: [place];  Service: Open Heart Surgery;  Laterality: N/A;  . HEMORRHOID SURGERY    . LEFT HEART CATH AND CORONARY ANGIOGRAPHY N/A 06/26/2018   Procedure: LEFT HEART CATH AND CORONARY ANGIOGRAPHY;  Surgeon: Belva Crome, MD;  Location: [place] CV LAB;  Service: Cardiovascular;  Laterality: N/A;  . TEE WITHOUT CARDIOVERSION N/A 06/29/2018   Procedure: Transesophageal Echocardiogram (Tee);  Surgeon: Gaye Pollack, MD;  Location:  [place] OR;  Service: Open Heart Surgery;  Laterality: N/A;     Current Meds  Medication Sig  . aspirin EC 325 MG EC tablet Take 1 tablet (325 mg total) by mouth daily.  Marland Kitchen atorvastatin (LIPITOR) 80 MG tablet Take 1 tablet (80 mg total) by mouth daily at 6 PM.  . Cholecalciferol (VITAMIN D3) 125 MCG (5000 UT) CAPS Take 5,000 Units by mouth daily.  . citalopram (CELEXA) 10 MG tablet Take 10 mg by mouth daily.  Marland Kitchen  co-enzyme Q-10 30 MG capsule Take 30 mg by mouth daily.  Marland Kitchen ezetimibe (ZETIA) 10 MG tablet Take 1 tablet (10 mg total) by mouth daily.  . furosemide (LASIX) 40 MG tablet Take 1 tablet (40 mg total) by mouth every other day.  . gabapentin (NEURONTIN) 100 MG capsule Take 100 mg twice a day  . Omega-3 1000 MG CAPS Take 2 g by mouth daily.  Marland Kitchen rOPINIRole (REQUIP) 2 MG tablet Take 2-4 mg by mouth See admin instructions. Takes 2 mg in the morning and 4 mg in the evening  . saw palmetto 160 MG capsule Take 160 mg by mouth daily.  . traMADol (ULTRAM) 50 MG tablet Take 1 tablet (50 mg total) by mouth every 12 (twelve) hours as needed for moderate pain. Must get refills with PCP  . traZODone (DESYREL) 100 MG tablet Take 100 mg by mouth at bedtime.  . vitamin B-12 (CYANOCOBALAMIN) 100 MCG tablet Take 100 mcg by mouth daily.     Allergies:   Levofloxacin   Social History   Tobacco Use  . Smoking status: Never Smoker  . Smokeless tobacco: Never Used  Substance Use Topics  . Alcohol use: Yes  . Drug use: Never     Family Hx: The patient's family history is not on file.  ROS:   Please see the history of present illness.    All other systems reviewed and are negative.   Prior CV studies:   The following studies were reviewed today:  Cardiac Cath 06/26/2018  Severe, calcified, coronary artery disease with subtotally occluded ostial to proximal RCA within a "[place]'s Crook". RCA fills by left-to-right collaterals and also antegrade.  50% left main.  Proximal to mid 90% LAD. The significant diagonal contains 80% stenosis. The LAD beyond the diagonal is totally occluded and fills by left to left collaterals.  2 obtuse marginals arise from the circumflex and are the sources of collaterals to the right coronary and LAD.  Elevated LVEDP, 25 mmHg.  RECOMMENDATIONS:   No interventional options exist.  Discussed with treating team, Dr. Martinique.   Management options would include  medical therapy versus high risk CABG.   Echo 06/26/18: IMPRESSIONS    1. The left ventricle has normal systolic function with an ejection fraction of 60-65%. The cavity size was normal. Left ventricular diastolic Doppler parameters are consistent with pseudonormalization.  2. The right ventricle has low normal systolic function. The cavity was mildly enlarged. There is no increase in right ventricular wall thickness.  3. Left atrial size was mildly dilated.  4. Right atrial size was mildly dilated.  5. Mitral valve regurgitation is mild to moderate by color flow Doppler.  6. Moderate thickening of the aortic valve. Moderate calcification of the aortic valve. Aortic valve regurgitation is mild by color flow Doppler. Moderate aortic annular calcification noted.  7. The tricuspid valve is not well visualized. Tricuspid valve regurgitation was not assessed by color flow Doppler.  8. The aortic root and ascending  aorta are normal in size and structure.  9. The interatrial septum was not assessed.   Labs/Other Tests and Data Reviewed:    EKG:  No ECG reviewed.  Recent Labs: 06/30/2018: Magnesium 2.4 07/03/2018: Hemoglobin 8.5; Platelets 209 09/21/2018: ALT 17; BUN 23; Creatinine, Ser 1.85; Potassium 5.4; Sodium 142   Recent Lipid Panel Lab Results  Component Value Date/Time   CHOL 155 09/21/2018 09:25 AM   TRIG 143 09/21/2018 09:25 AM   HDL 43 09/21/2018 09:25 AM   CHOLHDL 3.6 09/21/2018 09:25 AM   CHOLHDL 3.2 06/26/2018 11:06 AM   LDLCALC 83 09/21/2018 09:25 AM    Wt Readings from Last 3 Encounters:  11/20/18 218 lb 6 oz (99.1 kg)  08/20/18 244 lb 6.4 oz (110.9 kg)  08/04/18 214 lb (97.1 kg)     Objective:    Vital Signs:  BP (!) 127/54   Pulse 69   Ht 5\' 10"  (1.778 m)   Wt 218 lb 6 oz (99.1 kg)   SpO2 96%   BMI 31.33 kg/m    VITAL SIGNS:  reviewed  ASSESSMENT & PLAN:    1.  Coronary artery disease: History of multivessel disease status post four-vessel coronary  artery bypass grafting on 06/29/2018. He has made an excellent recovery. Continue current medical therapy. Recommend he drive only during the day and short local drives. No other restrictions.   2.  Hypercholesterolemia: Remains on atorvastatin 80 mg daily and Zetia.  had some recent lab work in [place]. Will get a copy of results.   3.  GERD: Stable without further complaints at this time.  COVID-19 Education: The signs and symptoms of COVID-19 were discussed with the patient and how to seek care for testing (follow up with PCP or arrange E-visit).  The importance of social distancing was discussed today.  Time:   Today, I have spent 15 minutes with the patient with telehealth technology discussing the above problems.     Medication Adjustments/Labs and Tests Ordered: Current medicines are reviewed at length with the patient today.  Concerns regarding medicines are outlined above.   Tests Ordered: No orders of the defined types were placed in this encounter.   Medication Changes: No orders of the defined types were placed in this encounter.   Follow Up:  In Person in 6 month(s)  Signed, Ciaira Natividad Martinique, MD  11/20/2018 9:28 AM    [place]

## 2018-11-20 ENCOUNTER — Telehealth (INDEPENDENT_AMBULATORY_CARE_PROVIDER_SITE_OTHER): Payer: Medicare Other | Admitting: Cardiology

## 2018-11-20 ENCOUNTER — Ambulatory Visit: Payer: Medicare Other | Admitting: Cardiology

## 2018-11-20 ENCOUNTER — Encounter: Payer: Self-pay | Admitting: Cardiology

## 2018-11-20 VITALS — BP 127/54 | HR 69 | Ht 70.0 in | Wt 218.4 lb

## 2018-11-20 DIAGNOSIS — Z951 Presence of aortocoronary bypass graft: Secondary | ICD-10-CM

## 2018-11-20 DIAGNOSIS — I2510 Atherosclerotic heart disease of native coronary artery without angina pectoris: Secondary | ICD-10-CM | POA: Diagnosis not present

## 2018-11-20 DIAGNOSIS — E7800 Pure hypercholesterolemia, unspecified: Secondary | ICD-10-CM

## 2018-11-20 DIAGNOSIS — E785 Hyperlipidemia, unspecified: Secondary | ICD-10-CM

## 2018-11-20 NOTE — Patient Instructions (Signed)
Medication Instructions:  Continue same medications *If you need a refill on your cardiac medications before your next appointment, please call your pharmacy*  Lab Work: None ordered   Testing/Procedures: None ordered  Follow-Up: At [facility], you and your health needs are our priority.  As part of our continuing mission to provide you with exceptional heart care, we have created designated Provider Care Teams.  These Care Teams include your primary Cardiologist (physician) and Advanced Practice Providers (APPs -  Physician Assistants and Nurse Practitioners) who all work together to provide you with the care you need, when you need it.  Your next appointment:  6 months   Call in January to schedule a April appointment

## 2018-11-24 DIAGNOSIS — Z961 Presence of intraocular lens: Secondary | ICD-10-CM | POA: Diagnosis not present

## 2018-11-24 DIAGNOSIS — H40023 Open angle with borderline findings, high risk, bilateral: Secondary | ICD-10-CM | POA: Diagnosis not present

## 2018-12-22 DIAGNOSIS — N183 Chronic kidney disease, stage 3 unspecified: Secondary | ICD-10-CM | POA: Diagnosis not present

## 2018-12-22 DIAGNOSIS — Z299 Encounter for prophylactic measures, unspecified: Secondary | ICD-10-CM | POA: Diagnosis not present

## 2018-12-22 DIAGNOSIS — Z7189 Other specified counseling: Secondary | ICD-10-CM | POA: Diagnosis not present

## 2018-12-22 DIAGNOSIS — Z1339 Encounter for screening examination for other mental health and behavioral disorders: Secondary | ICD-10-CM | POA: Diagnosis not present

## 2018-12-22 DIAGNOSIS — E1165 Type 2 diabetes mellitus with hyperglycemia: Secondary | ICD-10-CM | POA: Diagnosis not present

## 2018-12-22 DIAGNOSIS — Z1331 Encounter for screening for depression: Secondary | ICD-10-CM | POA: Diagnosis not present

## 2018-12-22 DIAGNOSIS — R5383 Other fatigue: Secondary | ICD-10-CM | POA: Diagnosis not present

## 2018-12-22 DIAGNOSIS — Z6834 Body mass index (BMI) 34.0-34.9, adult: Secondary | ICD-10-CM | POA: Diagnosis not present

## 2018-12-22 DIAGNOSIS — K219 Gastro-esophageal reflux disease without esophagitis: Secondary | ICD-10-CM | POA: Diagnosis not present

## 2018-12-22 DIAGNOSIS — Z125 Encounter for screening for malignant neoplasm of prostate: Secondary | ICD-10-CM | POA: Diagnosis not present

## 2018-12-22 DIAGNOSIS — Z79899 Other long term (current) drug therapy: Secondary | ICD-10-CM | POA: Diagnosis not present

## 2018-12-22 DIAGNOSIS — Z0000 Encounter for general adult medical examination without abnormal findings: Secondary | ICD-10-CM | POA: Diagnosis not present

## 2018-12-22 DIAGNOSIS — Z1211 Encounter for screening for malignant neoplasm of colon: Secondary | ICD-10-CM | POA: Diagnosis not present

## 2018-12-22 DIAGNOSIS — E7800 Pure hypercholesterolemia, unspecified: Secondary | ICD-10-CM | POA: Diagnosis not present

## 2019-01-07 DIAGNOSIS — E7800 Pure hypercholesterolemia, unspecified: Secondary | ICD-10-CM | POA: Diagnosis not present

## 2019-01-07 DIAGNOSIS — M159 Polyosteoarthritis, unspecified: Secondary | ICD-10-CM | POA: Diagnosis not present

## 2019-01-07 DIAGNOSIS — E119 Type 2 diabetes mellitus without complications: Secondary | ICD-10-CM | POA: Diagnosis not present

## 2019-01-07 DIAGNOSIS — J449 Chronic obstructive pulmonary disease, unspecified: Secondary | ICD-10-CM | POA: Diagnosis not present

## 2019-02-05 DIAGNOSIS — I208 Other forms of angina pectoris: Secondary | ICD-10-CM | POA: Diagnosis not present

## 2019-02-05 DIAGNOSIS — I2510 Atherosclerotic heart disease of native coronary artery without angina pectoris: Secondary | ICD-10-CM | POA: Diagnosis not present

## 2019-02-05 DIAGNOSIS — R0609 Other forms of dyspnea: Secondary | ICD-10-CM | POA: Diagnosis not present

## 2019-02-05 DIAGNOSIS — R011 Cardiac murmur, unspecified: Secondary | ICD-10-CM | POA: Diagnosis not present

## 2019-02-19 DIAGNOSIS — E7800 Pure hypercholesterolemia, unspecified: Secondary | ICD-10-CM | POA: Diagnosis not present

## 2019-02-19 DIAGNOSIS — R0609 Other forms of dyspnea: Secondary | ICD-10-CM | POA: Diagnosis not present

## 2019-02-19 DIAGNOSIS — J449 Chronic obstructive pulmonary disease, unspecified: Secondary | ICD-10-CM | POA: Diagnosis not present

## 2019-02-19 DIAGNOSIS — I2510 Atherosclerotic heart disease of native coronary artery without angina pectoris: Secondary | ICD-10-CM | POA: Diagnosis not present

## 2019-02-19 DIAGNOSIS — R011 Cardiac murmur, unspecified: Secondary | ICD-10-CM | POA: Diagnosis not present

## 2019-02-19 DIAGNOSIS — R9431 Abnormal electrocardiogram [ECG] [EKG]: Secondary | ICD-10-CM | POA: Diagnosis not present

## 2019-02-19 DIAGNOSIS — M159 Polyosteoarthritis, unspecified: Secondary | ICD-10-CM | POA: Diagnosis not present

## 2019-02-19 DIAGNOSIS — E119 Type 2 diabetes mellitus without complications: Secondary | ICD-10-CM | POA: Diagnosis not present

## 2019-03-08 DIAGNOSIS — L570 Actinic keratosis: Secondary | ICD-10-CM | POA: Diagnosis not present

## 2019-03-09 DIAGNOSIS — J449 Chronic obstructive pulmonary disease, unspecified: Secondary | ICD-10-CM | POA: Diagnosis not present

## 2019-03-09 DIAGNOSIS — N183 Chronic kidney disease, stage 3 unspecified: Secondary | ICD-10-CM | POA: Diagnosis not present

## 2019-03-09 DIAGNOSIS — Z6834 Body mass index (BMI) 34.0-34.9, adult: Secondary | ICD-10-CM | POA: Diagnosis not present

## 2019-03-09 DIAGNOSIS — Z299 Encounter for prophylactic measures, unspecified: Secondary | ICD-10-CM | POA: Diagnosis not present

## 2019-03-09 DIAGNOSIS — G2581 Restless legs syndrome: Secondary | ICD-10-CM | POA: Diagnosis not present

## 2019-03-09 DIAGNOSIS — I25700 Atherosclerosis of coronary artery bypass graft(s), unspecified, with unstable angina pectoris: Secondary | ICD-10-CM | POA: Diagnosis not present

## 2019-03-10 DIAGNOSIS — Z9181 History of falling: Secondary | ICD-10-CM | POA: Diagnosis not present

## 2019-03-10 DIAGNOSIS — G2581 Restless legs syndrome: Secondary | ICD-10-CM | POA: Diagnosis not present

## 2019-03-10 DIAGNOSIS — J410 Simple chronic bronchitis: Secondary | ICD-10-CM | POA: Diagnosis not present

## 2019-03-10 DIAGNOSIS — T161XXA Foreign body in right ear, initial encounter: Secondary | ICD-10-CM | POA: Diagnosis not present

## 2019-03-10 DIAGNOSIS — G4733 Obstructive sleep apnea (adult) (pediatric): Secondary | ICD-10-CM | POA: Diagnosis not present

## 2019-03-10 DIAGNOSIS — H6123 Impacted cerumen, bilateral: Secondary | ICD-10-CM | POA: Diagnosis not present

## 2019-03-30 DIAGNOSIS — E1122 Type 2 diabetes mellitus with diabetic chronic kidney disease: Secondary | ICD-10-CM | POA: Diagnosis not present

## 2019-03-30 DIAGNOSIS — Z299 Encounter for prophylactic measures, unspecified: Secondary | ICD-10-CM | POA: Diagnosis not present

## 2019-03-30 DIAGNOSIS — E1165 Type 2 diabetes mellitus with hyperglycemia: Secondary | ICD-10-CM | POA: Diagnosis not present

## 2019-03-30 DIAGNOSIS — J449 Chronic obstructive pulmonary disease, unspecified: Secondary | ICD-10-CM | POA: Diagnosis not present

## 2019-03-30 DIAGNOSIS — N183 Chronic kidney disease, stage 3 unspecified: Secondary | ICD-10-CM | POA: Diagnosis not present

## 2019-03-30 DIAGNOSIS — Z87891 Personal history of nicotine dependence: Secondary | ICD-10-CM | POA: Diagnosis not present

## 2019-03-30 DIAGNOSIS — Z6835 Body mass index (BMI) 35.0-35.9, adult: Secondary | ICD-10-CM | POA: Diagnosis not present

## 2019-04-07 DIAGNOSIS — U071 COVID-19: Secondary | ICD-10-CM | POA: Diagnosis not present

## 2019-04-07 DIAGNOSIS — J410 Simple chronic bronchitis: Secondary | ICD-10-CM | POA: Diagnosis not present

## 2019-04-07 DIAGNOSIS — G4733 Obstructive sleep apnea (adult) (pediatric): Secondary | ICD-10-CM | POA: Diagnosis not present

## 2019-04-15 DIAGNOSIS — Z23 Encounter for immunization: Secondary | ICD-10-CM | POA: Diagnosis not present

## 2019-04-20 DIAGNOSIS — G2581 Restless legs syndrome: Secondary | ICD-10-CM | POA: Diagnosis not present

## 2019-04-20 DIAGNOSIS — G4700 Insomnia, unspecified: Secondary | ICD-10-CM | POA: Diagnosis not present

## 2019-04-20 DIAGNOSIS — F411 Generalized anxiety disorder: Secondary | ICD-10-CM | POA: Diagnosis not present

## 2019-04-20 DIAGNOSIS — E7800 Pure hypercholesterolemia, unspecified: Secondary | ICD-10-CM | POA: Diagnosis not present

## 2019-04-20 DIAGNOSIS — E669 Obesity, unspecified: Secondary | ICD-10-CM | POA: Diagnosis not present

## 2019-04-20 DIAGNOSIS — E119 Type 2 diabetes mellitus without complications: Secondary | ICD-10-CM | POA: Diagnosis not present

## 2019-04-20 DIAGNOSIS — M159 Polyosteoarthritis, unspecified: Secondary | ICD-10-CM | POA: Diagnosis not present

## 2019-04-20 DIAGNOSIS — J449 Chronic obstructive pulmonary disease, unspecified: Secondary | ICD-10-CM | POA: Diagnosis not present

## 2019-05-13 DIAGNOSIS — Z23 Encounter for immunization: Secondary | ICD-10-CM | POA: Diagnosis not present

## 2019-05-16 NOTE — Progress Notes (Signed)
Cardiology Office Note   Date:  05/25/2019   ID:  John Palmer, DOB 03/17/1925, MRN [id number]  PCP:  Monico Blitz, MD  Cardiologist:   Peter Martinique, MD   Chief Complaint  Patient presents with  . Coronary Artery Disease  . Hypertension      History of Present Illness: John Palmer is a 86 y.o. male who is seen for follow up CAD. He has a history of CAD. He was admitted to  the hospital in May 2020 for chest pain and abnormal EKG. Patient had a cardiac catheterization which revealed severe diffuse coronary artery disease with subtotally occluded ostial to proximal RCA filling antegrade and retrograde by collaterals from the left. He was also found to have a 50% ostial left main stenosis, proximal mid LAD 90% stenosis, the diagonal branch had 80% stenosis the LAD beyond the diagonal was totally occluded and filled by left to left collaterals. LVEDP was 25. He was found to have normal LV systolic function of 16% to 65% with moderate MR.  Patient subsequently was referred to CVTS and had coronary artery bypass graftingx4 on 06/29/2018 (SVG to LAD, SVG to diagonal, SVG to OM, SVG to PDA, with endoscopic vein harvest from the right leg on 06/29/2018. The patient on discharge was sent home on oxygen, physical therapy, and home health.  Patient has made an excellent recovery from the CABG. He did get Covid 19 associated with HA, congestion, loss of smell. He has recovered well from this. He has since been vaccinated. He is walking regularly and keeps a garden. No longer has to do other yard work. Son notes since his surgery he has a little more imbalance. Also notes discomfort at his vein harvest sites like burning.   He denies any CP, SOB, palpitations. Energy level is good.     Past Medical History:  Diagnosis Date  . GERD (gastroesophageal reflux disease)   . Hypercholesterolemia     Past Surgical History:  Procedure Laterality Date  . "stomach wrap"    . BLADDER SURGERY     . CORONARY ARTERY BYPASS GRAFT N/A 06/29/2018   Procedure: CORONARY ARTERY BYPASS GRAFTING (CABG)TIMES FOUR USING THE RIGHT GREATER SAPHENOUS VEIN, HARVESTED ENDOSCOPICALLY;  Surgeon: Gaye Pollack, MD;  Location: [facility];  Service: Open Heart Surgery;  Laterality: N/A;  . HEMORRHOID SURGERY    . LEFT HEART CATH AND CORONARY ANGIOGRAPHY N/A 06/26/2018   Procedure: LEFT HEART CATH AND CORONARY ANGIOGRAPHY;  Surgeon: Belva Crome, MD;  Location: [facility];  Service: Cardiovascular;  Laterality: N/A;  . TEE WITHOUT CARDIOVERSION N/A 06/29/2018   Procedure: Transesophageal Echocardiogram (Tee);  Surgeon: Gaye Pollack, MD;  Location: [facility];  Service: Open Heart Surgery;  Laterality: N/A;     Current Outpatient Medications  Medication Sig Dispense Refill  . atorvastatin (LIPITOR) 80 MG tablet Take 1 tablet (80 mg total) by mouth daily at 6 PM. 90 tablet 3  . Cholecalciferol (VITAMIN D3) 125 MCG (5000 UT) CAPS Take 5,000 Units by mouth daily.    . citalopram (CELEXA) 10 MG tablet Take 10 mg by mouth daily.    Marland Kitchen co-enzyme Q-10 30 MG capsule Take 30 mg by mouth daily.    Marland Kitchen ezetimibe (ZETIA) 10 MG tablet Take 1 tablet (10 mg total) by mouth daily. 90 tablet 3  . furosemide (LASIX) 40 MG tablet Take 1 tablet (40 mg total) by mouth every other day. 90 tablet 1  . gabapentin (  NEURONTIN) 100 MG capsule Take 100 mg twice a day    . Omega-3 1000 MG CAPS Take 2 g by mouth daily.    Marland Kitchen rOPINIRole (REQUIP) 2 MG tablet Take 2-4 mg by mouth See admin instructions. Takes 2 mg in the morning and 4 mg in the evening    . saw palmetto 160 MG capsule Take 160 mg by mouth daily.    . traMADol (ULTRAM) 50 MG tablet Take 1 tablet (50 mg total) by mouth every 12 (twelve) hours as needed for moderate pain. Must get refills with PCP 30 tablet 0  . traZODone (DESYREL) 100 MG tablet Take 100 mg by mouth at bedtime.    . vitamin B-12 (CYANOCOBALAMIN) 100 MCG tablet Take 100 mcg by mouth daily.     No current  facility-administered medications for this visit.    Allergies:   Levofloxacin    Social History:  The patient  reports that he has never smoked. He has never used smokeless tobacco. He reports current alcohol use. He reports that he does not use drugs.   Family History:  The patient's family history is not on file.    ROS:  Please see the history of present illness.   Otherwise, review of systems are positive for none.   All other systems are reviewed and negative.    PHYSICAL EXAM: VS:  BP (!) 144/76 (BP Location: Right Arm, Cuff Size: Normal)   Pulse 64   Ht 5\' 8"  (1.727 m)   Wt 226 lb (102.5 kg)   BMI 34.36 kg/m  , BMI Body mass index is 34.36 kg/m. GEN: Well nourished, overweight, in no acute distress  HEENT: normal  Neck: no JVD, carotid bruits, or masses Cardiac: RRR; no murmurs, rubs, or gallops,no edema  Respiratory:  clear to auscultation bilaterally, normal work of breathing GI: soft, nontender, nondistended, + BS MS: no deformity or atrophy  Skin: warm and dry, no rash, all surgical incisions have healed well.  Neuro:  Strength and sensation are intact Psych: euthymic mood, full affect   EKG:  EKG is ordered today. The ekg ordered today demonstrates NSR with rate 64. RBBB. I have personally reviewed and interpreted this study.    Recent Labs: 06/30/2018: Magnesium 2.4 07/03/2018: Hemoglobin 8.5; Platelets 209 09/21/2018: ALT 17; BUN 23; Creatinine, Ser 1.85; Potassium 5.4; Sodium 142    Lipid Panel    Component Value Date/Time   CHOL 155 09/21/2018 0925   TRIG 143 09/21/2018 0925   HDL 43 09/21/2018 0925   CHOLHDL 3.6 09/21/2018 0925   CHOLHDL 3.2 06/26/2018 1106   VLDL 24 06/26/2018 1106   LDLCALC 83 09/21/2018 0925    Dated 12/22/18: cholesterol 143, triglycerides 99, HDL 45, LDL 89. TSH normal. Dated 03/09/19: creatinine 1.76. otherwise CMET and CBC normal.   Wt Readings from Last 3 Encounters:  05/25/19 226 lb (102.5 kg)  11/20/18 218 lb 6 oz  (99.1 kg)  08/20/18 244 lb 6.4 oz (110.9 kg)      Other studies Reviewed: Additional studies/ records that were reviewed today include:   Cardiac Cath5/29/2020  Severe, calcified, coronary artery disease with subtotally occluded ostial to proximal RCA within a "[place]pherd[place]ok". RCA fills by left-to-right collaterals and also antegrade.  50% left main.  Proximal to mid 90% LAD. The significant diagonal contains 80% stenosis. The LAD beyond the diagonal is totally occluded and fills by left to left collaterals.  2 obtuse marginals arise from the circumflex and are the sources  of collaterals to the right coronary and LAD.  Elevated LVEDP, 25 mmHg.  RECOMMENDATIONS:   No interventional options exist.  Discussed with treating team, Dr. Martinique.   Management options would include medical therapy versus high risk CABG.   Echo 06/26/18: IMPRESSIONS   1. The left ventricle has normal systolic function with an ejection fraction of 60-65%. The cavity size was normal. Left ventricular diastolic Doppler parameters are consistent with pseudonormalization. 2. The right ventricle has low normal systolic function. The cavity was mildly enlarged. There is no increase in right ventricular wall thickness. 3. Left atrial size was mildly dilated. 4. Right atrial size was mildly dilated. 5. Mitral valve regurgitation is mild to moderate by color flow Doppler. 6. Moderate thickening of the aortic valve. Moderate calcification of the aortic valve. Aortic valve regurgitation is mild by color flow Doppler. Moderate aortic annular calcification noted. 7. The tricuspid valve is not well visualized. Tricuspid valve regurgitation was not assessed by color flow Doppler. 8. The aortic root and ascending aorta are normal in size and structure. 9. The interatrial septum was not assessed.   ASSESSMENT AND PLAN:  1.Coronary artery disease:History of multivessel disease status post  four-vessel coronary artery bypass grafting on 06/29/2018. He has made an excellent recovery. Recommend reducing ASA to 81 mg daily.  Continue statin.   2.Hypercholesterolemia: Remains on atorvastatin 80 mg daily and Zetia. LDL 89.   3.GERD:Stable without further complaints at this time.   Current medicines are reviewed at length with the patient today.  The patient does not have concerns regarding medicines.  The following changes have been made:  Reduce ASA to 81 mg daily   Labs/ tests ordered today include:  No orders of the defined types were placed in this encounter.    Disposition:   FU with me in 6 months  Signed, Peter Martinique, MD  05/25/2019 9:19 AM    [facility] [address], [place], [place], [address] Phone [phone], Fax [phone]

## 2019-05-18 DIAGNOSIS — M159 Polyosteoarthritis, unspecified: Secondary | ICD-10-CM | POA: Diagnosis not present

## 2019-05-18 DIAGNOSIS — E7800 Pure hypercholesterolemia, unspecified: Secondary | ICD-10-CM | POA: Diagnosis not present

## 2019-05-18 DIAGNOSIS — E119 Type 2 diabetes mellitus without complications: Secondary | ICD-10-CM | POA: Diagnosis not present

## 2019-05-18 DIAGNOSIS — J449 Chronic obstructive pulmonary disease, unspecified: Secondary | ICD-10-CM | POA: Diagnosis not present

## 2019-05-24 DIAGNOSIS — F411 Generalized anxiety disorder: Secondary | ICD-10-CM | POA: Diagnosis not present

## 2019-05-24 DIAGNOSIS — R413 Other amnesia: Secondary | ICD-10-CM | POA: Diagnosis not present

## 2019-05-24 DIAGNOSIS — E669 Obesity, unspecified: Secondary | ICD-10-CM | POA: Diagnosis not present

## 2019-05-24 DIAGNOSIS — G2581 Restless legs syndrome: Secondary | ICD-10-CM | POA: Diagnosis not present

## 2019-05-25 ENCOUNTER — Other Ambulatory Visit: Payer: Self-pay

## 2019-05-25 ENCOUNTER — Encounter: Payer: Self-pay | Admitting: Cardiology

## 2019-05-25 ENCOUNTER — Ambulatory Visit (INDEPENDENT_AMBULATORY_CARE_PROVIDER_SITE_OTHER): Payer: Medicare Other | Admitting: Cardiology

## 2019-05-25 VITALS — BP 144/76 | HR 64 | Ht 68.0 in | Wt 226.0 lb

## 2019-05-25 DIAGNOSIS — I2510 Atherosclerotic heart disease of native coronary artery without angina pectoris: Secondary | ICD-10-CM

## 2019-05-25 DIAGNOSIS — E785 Hyperlipidemia, unspecified: Secondary | ICD-10-CM

## 2019-05-25 DIAGNOSIS — Z951 Presence of aortocoronary bypass graft: Secondary | ICD-10-CM

## 2019-05-25 MED ORDER — ASPIRIN EC 81 MG PO TBEC: 81.0000 mg | DELAYED_RELEASE_TABLET | Freq: Every day | ORAL | 3 refills | Status: AC

## 2019-06-27 DIAGNOSIS — E119 Type 2 diabetes mellitus without complications: Secondary | ICD-10-CM | POA: Diagnosis not present

## 2019-06-27 DIAGNOSIS — E7800 Pure hypercholesterolemia, unspecified: Secondary | ICD-10-CM | POA: Diagnosis not present

## 2019-06-27 DIAGNOSIS — J449 Chronic obstructive pulmonary disease, unspecified: Secondary | ICD-10-CM | POA: Diagnosis not present

## 2019-06-27 DIAGNOSIS — M159 Polyosteoarthritis, unspecified: Secondary | ICD-10-CM | POA: Diagnosis not present

## 2019-07-05 DIAGNOSIS — N183 Chronic kidney disease, stage 3 unspecified: Secondary | ICD-10-CM | POA: Diagnosis not present

## 2019-07-05 DIAGNOSIS — Z299 Encounter for prophylactic measures, unspecified: Secondary | ICD-10-CM | POA: Diagnosis not present

## 2019-07-05 DIAGNOSIS — E1122 Type 2 diabetes mellitus with diabetic chronic kidney disease: Secondary | ICD-10-CM | POA: Diagnosis not present

## 2019-07-05 DIAGNOSIS — J449 Chronic obstructive pulmonary disease, unspecified: Secondary | ICD-10-CM | POA: Diagnosis not present

## 2019-07-05 DIAGNOSIS — E1165 Type 2 diabetes mellitus with hyperglycemia: Secondary | ICD-10-CM | POA: Diagnosis not present

## 2019-07-28 DIAGNOSIS — E7800 Pure hypercholesterolemia, unspecified: Secondary | ICD-10-CM | POA: Diagnosis not present

## 2019-07-28 DIAGNOSIS — J449 Chronic obstructive pulmonary disease, unspecified: Secondary | ICD-10-CM | POA: Diagnosis not present

## 2019-07-28 DIAGNOSIS — E119 Type 2 diabetes mellitus without complications: Secondary | ICD-10-CM | POA: Diagnosis not present

## 2019-07-28 DIAGNOSIS — M159 Polyosteoarthritis, unspecified: Secondary | ICD-10-CM | POA: Diagnosis not present

## 2019-08-18 ENCOUNTER — Other Ambulatory Visit: Payer: Self-pay | Admitting: *Deleted

## 2019-08-18 MED ORDER — ATORVASTATIN CALCIUM 80 MG PO TABS: 80.0000 mg | ORAL_TABLET | Freq: Every day | ORAL | 3 refills | Status: DC

## 2019-08-25 DIAGNOSIS — D485 Neoplasm of uncertain behavior of skin: Secondary | ICD-10-CM | POA: Diagnosis not present

## 2019-08-25 DIAGNOSIS — C44611 Basal cell carcinoma of skin of unspecified upper limb, including shoulder: Secondary | ICD-10-CM | POA: Diagnosis not present

## 2019-08-27 DIAGNOSIS — J449 Chronic obstructive pulmonary disease, unspecified: Secondary | ICD-10-CM | POA: Diagnosis not present

## 2019-08-27 DIAGNOSIS — M159 Polyosteoarthritis, unspecified: Secondary | ICD-10-CM | POA: Diagnosis not present

## 2019-08-27 DIAGNOSIS — E7800 Pure hypercholesterolemia, unspecified: Secondary | ICD-10-CM | POA: Diagnosis not present

## 2019-08-27 DIAGNOSIS — E119 Type 2 diabetes mellitus without complications: Secondary | ICD-10-CM | POA: Diagnosis not present

## 2019-09-06 ENCOUNTER — Other Ambulatory Visit: Payer: Self-pay | Admitting: Adult Health

## 2019-09-09 DIAGNOSIS — J449 Chronic obstructive pulmonary disease, unspecified: Secondary | ICD-10-CM | POA: Diagnosis not present

## 2019-09-09 DIAGNOSIS — E119 Type 2 diabetes mellitus without complications: Secondary | ICD-10-CM | POA: Diagnosis not present

## 2019-09-09 DIAGNOSIS — M159 Polyosteoarthritis, unspecified: Secondary | ICD-10-CM | POA: Diagnosis not present

## 2019-09-09 DIAGNOSIS — E7800 Pure hypercholesterolemia, unspecified: Secondary | ICD-10-CM | POA: Diagnosis not present

## 2019-10-05 DIAGNOSIS — C44721 Squamous cell carcinoma of skin of unspecified lower limb, including hip: Secondary | ICD-10-CM | POA: Diagnosis not present

## 2019-10-05 DIAGNOSIS — D485 Neoplasm of uncertain behavior of skin: Secondary | ICD-10-CM | POA: Diagnosis not present

## 2019-10-05 DIAGNOSIS — C44612 Basal cell carcinoma of skin of right upper limb, including shoulder: Secondary | ICD-10-CM | POA: Diagnosis not present

## 2019-10-06 DIAGNOSIS — G4733 Obstructive sleep apnea (adult) (pediatric): Secondary | ICD-10-CM | POA: Diagnosis not present

## 2019-10-06 DIAGNOSIS — R0603 Acute respiratory distress: Secondary | ICD-10-CM | POA: Diagnosis not present

## 2019-10-06 DIAGNOSIS — J410 Simple chronic bronchitis: Secondary | ICD-10-CM | POA: Diagnosis not present

## 2019-10-06 DIAGNOSIS — R05 Cough: Secondary | ICD-10-CM | POA: Diagnosis not present

## 2019-10-28 DIAGNOSIS — J449 Chronic obstructive pulmonary disease, unspecified: Secondary | ICD-10-CM | POA: Diagnosis not present

## 2019-10-28 DIAGNOSIS — M159 Polyosteoarthritis, unspecified: Secondary | ICD-10-CM | POA: Diagnosis not present

## 2019-10-28 DIAGNOSIS — E7800 Pure hypercholesterolemia, unspecified: Secondary | ICD-10-CM | POA: Diagnosis not present

## 2019-10-28 DIAGNOSIS — E119 Type 2 diabetes mellitus without complications: Secondary | ICD-10-CM | POA: Diagnosis not present

## 2019-11-08 ENCOUNTER — Other Ambulatory Visit: Payer: Self-pay | Admitting: Adult Health

## 2019-11-08 NOTE — Telephone Encounter (Signed)
Rx has been sent to the pharmacy electronically. ° °

## 2019-11-15 DIAGNOSIS — C44729 Squamous cell carcinoma of skin of left lower limb, including hip: Secondary | ICD-10-CM | POA: Diagnosis not present

## 2019-11-23 DIAGNOSIS — R413 Other amnesia: Secondary | ICD-10-CM | POA: Diagnosis not present

## 2019-11-23 DIAGNOSIS — G2581 Restless legs syndrome: Secondary | ICD-10-CM | POA: Diagnosis not present

## 2019-11-23 DIAGNOSIS — G4700 Insomnia, unspecified: Secondary | ICD-10-CM | POA: Diagnosis not present

## 2019-11-23 DIAGNOSIS — F411 Generalized anxiety disorder: Secondary | ICD-10-CM | POA: Diagnosis not present

## 2019-11-26 DIAGNOSIS — E7800 Pure hypercholesterolemia, unspecified: Secondary | ICD-10-CM | POA: Diagnosis not present

## 2019-11-26 DIAGNOSIS — J449 Chronic obstructive pulmonary disease, unspecified: Secondary | ICD-10-CM | POA: Diagnosis not present

## 2019-11-26 DIAGNOSIS — E119 Type 2 diabetes mellitus without complications: Secondary | ICD-10-CM | POA: Diagnosis not present

## 2019-11-26 DIAGNOSIS — M159 Polyosteoarthritis, unspecified: Secondary | ICD-10-CM | POA: Diagnosis not present

## 2019-11-29 DIAGNOSIS — H40013 Open angle with borderline findings, low risk, bilateral: Secondary | ICD-10-CM | POA: Diagnosis not present

## 2019-11-30 DIAGNOSIS — Z23 Encounter for immunization: Secondary | ICD-10-CM | POA: Diagnosis not present

## 2019-12-07 NOTE — Progress Notes (Signed)
Cardiology Office Note   Date:  12/09/2019   ID:  John Palmer, DOB 29-Nov-1925, MRN [id number]  PCP:  Monico Blitz, MD  Cardiologist:   Rodney Yera Martinique, MD   Chief Complaint  Patient presents with   Follow-up    6 months.   Coronary Artery Disease      History of Present Illness: John Palmer is a 86 y.o. male who is seen for follow up CAD. He has a history of CAD. He was admitted to  the hospital in May 2020 for chest pain and abnormal EKG. Patient had a cardiac catheterization which revealed severe diffuse coronary artery disease with subtotally occluded ostial to proximal RCA filling antegrade and retrograde by collaterals from the left. He was also found to have a 50% ostial left main stenosis, proximal mid LAD 90% stenosis, the diagonal branch had 80% stenosis the LAD beyond the diagonal was totally occluded and filled by left to left collaterals. LVEDP was 25. He was found to have normal LV systolic function of 32% to 65% with moderate MR.  Patient subsequently was referred to CVTS and had coronary artery bypass graftingx4 on 06/29/2018 (SVG to LAD, SVG to diagonal, SVG to OM, SVG to PDA, with endoscopic vein harvest from the right leg on 06/29/2018.   Patient has made an excellent recovery from the CABG. He did get Covid 19 associated with HA, congestion, loss of smell. He has recovered well from this. He has since been vaccinated.   He does walk regularly around the block. He denies any chest pain, dyspnea, palpitations. Generally feels very well. Labs followed by Dr Manuella Ghazi.    Past Medical History:  Diagnosis Date   GERD (gastroesophageal reflux disease)    Hypercholesterolemia     Past Surgical History:  Procedure Laterality Date   "stomach wrap"     BLADDER SURGERY     CORONARY ARTERY BYPASS GRAFT N/A 06/29/2018   Procedure: CORONARY ARTERY BYPASS GRAFTING (CABG)TIMES FOUR USING THE RIGHT GREATER SAPHENOUS VEIN, HARVESTED ENDOSCOPICALLY;  Surgeon:  Gaye Pollack, MD;  Location: [facility];  Service: Open Heart Surgery;  Laterality: N/A;   HEMORRHOID SURGERY     LEFT HEART CATH AND CORONARY ANGIOGRAPHY N/A 06/26/2018   Procedure: LEFT HEART CATH AND CORONARY ANGIOGRAPHY;  Surgeon: Belva Crome, MD;  Location: [facility];  Service: Cardiovascular;  Laterality: N/A;   TEE WITHOUT CARDIOVERSION N/A 06/29/2018   Procedure: Transesophageal Echocardiogram (Tee);  Surgeon: Gaye Pollack, MD;  Location: [facility];  Service: Open Heart Surgery;  Laterality: N/A;     Current Outpatient Medications  Medication Sig Dispense Refill   aspirin EC 81 MG tablet Take 1 tablet (81 mg total) by mouth daily. 90 tablet 3   atorvastatin (LIPITOR) 80 MG tablet Take 1 tablet (80 mg total) by mouth daily at 6 PM. 90 tablet 3   Cholecalciferol (VITAMIN D3) 125 MCG (5000 UT) CAPS Take 5,000 Units by mouth daily.     citalopram (CELEXA) 10 MG tablet Take 10 mg by mouth daily.     co-enzyme Q-10 30 MG capsule Take 30 mg by mouth daily.     ezetimibe (ZETIA) 10 MG tablet TAKE 1 TABLET (10 MG TOTAL) BY MOUTH DAILY. 90 tablet 3   furosemide (LASIX) 40 MG tablet TAKE 1 TABLET (40 MG TOTAL) BY MOUTH EVERY OTHER DAY. 90 tablet 1   gabapentin (NEURONTIN) 100 MG capsule Take 100 mg twice a day  Omega-3 1000 MG CAPS Take 2 g by mouth daily.     rOPINIRole (REQUIP) 2 MG tablet Take 2-4 mg by mouth See admin instructions. Takes 2 mg in the morning and 4 mg in the evening     saw palmetto 160 MG capsule Take 160 mg by mouth daily.     traMADol (ULTRAM) 50 MG tablet Take 1 tablet (50 mg total) by mouth every 12 (twelve) hours as needed for moderate pain. Must get refills with PCP 30 tablet 0   traZODone (DESYREL) 100 MG tablet Take 100 mg by mouth at bedtime.     vitamin B-12 (CYANOCOBALAMIN) 100 MCG tablet Take 100 mcg by mouth daily.     No current facility-administered medications for this visit.    Allergies:   Levofloxacin    Social History:   The patient  reports that he has never smoked. He has never used smokeless tobacco. He reports current alcohol use. He reports that he does not use drugs.   Family History:  The patient's family history is not on file.    ROS:  Please see the history of present illness.   Otherwise, review of systems are positive for none.   All other systems are reviewed and negative.    PHYSICAL EXAM: VS:  BP (!) 130/56 (BP Location: Left Arm, Patient Position: Sitting, Cuff Size: Large)    Pulse 77    Ht 5\' 8"  (1.727 m)    Wt 230 lb (104.3 kg)    BMI 34.97 kg/m  , BMI Body mass index is 34.97 kg/m. GEN: Well nourished, overweight, in no acute distress  HEENT: normal  Neck: no JVD, carotid bruits, or masses Cardiac: RRR; soft 1/6 SEM RUSB. Otherwise no murmurs, rubs, or gallops,no edema  Respiratory:  clear to auscultation bilaterally, normal work of breathing GI: soft, nontender, nondistended, + BS MS: no deformity or atrophy  Skin: warm and dry, no rash, all surgical incisions have healed well.  Neuro:  Strength and sensation are intact Psych: euthymic mood, full affect   EKG:  EKG is not ordered today.    Recent Labs: No results found for requested labs within last 8760 hours.    Lipid Panel    Component Value Date/Time   CHOL 155 09/21/2018 0925   TRIG 143 09/21/2018 0925   HDL 43 09/21/2018 0925   CHOLHDL 3.6 09/21/2018 0925   CHOLHDL 3.2 06/26/2018 1106   VLDL 24 06/26/2018 1106   LDLCALC 83 09/21/2018 0925    Dated 12/22/18: cholesterol 143, triglycerides 99, HDL 45, LDL 89. TSH normal. Dated 03/09/19: creatinine 1.76. otherwise CMET and CBC normal.   Wt Readings from Last 3 Encounters:  12/09/19 230 lb (104.3 kg)  05/25/19 226 lb (102.5 kg)  11/20/18 218 lb 6 oz (99.1 kg)      Other studies Reviewed: Additional studies/ records that were reviewed today include:   Cardiac Cath5/29/2020  Severe, calcified, coronary artery disease with subtotally occluded ostial to  proximal RCA within a "[place]pherd[place]ok". RCA fills by left-to-right collaterals and also antegrade.  50% left main.  Proximal to mid 90% LAD. The significant diagonal contains 80% stenosis. The LAD beyond the diagonal is totally occluded and fills by left to left collaterals.  2 obtuse marginals arise from the circumflex and are the sources of collaterals to the right coronary and LAD.  Elevated LVEDP, 25 mmHg.  RECOMMENDATIONS:   No interventional options exist.  Discussed with treating team, Dr. Martinique.   Management  options would include medical therapy versus high risk CABG.   Echo 06/26/18: IMPRESSIONS   1. The left ventricle has normal systolic function with an ejection fraction of 60-65%. The cavity size was normal. Left ventricular diastolic Doppler parameters are consistent with pseudonormalization. 2. The right ventricle has low normal systolic function. The cavity was mildly enlarged. There is no increase in right ventricular wall thickness. 3. Left atrial size was mildly dilated. 4. Right atrial size was mildly dilated. 5. Mitral valve regurgitation is mild to moderate by color flow Doppler. 6. Moderate thickening of the aortic valve. Moderate calcification of the aortic valve. Aortic valve regurgitation is mild by color flow Doppler. Moderate aortic annular calcification noted. 7. The tricuspid valve is not well visualized. Tricuspid valve regurgitation was not assessed by color flow Doppler. 8. The aortic root and ascending aorta are normal in size and structure. 9. The interatrial septum was not assessed.   ASSESSMENT AND PLAN:  1.Coronary artery disease:History of multivessel disease status post four-vessel coronary artery bypass grafting on 06/29/2018. He has made an excellent recovery. Recommend reducing ASA to 81 mg daily.  Continue statin. Follow up in one year  2.Hypercholesterolemia: Remains on atorvastatin 80 mg daily and Zetia. labs  followed by Dr Manuella Ghazi.  3.GERD:Stable without further complaints at this time.   Current medicines are reviewed at length with the patient today.  The patient does not have concerns regarding medicines.  The following changes have been made:  Reduce ASA to 81 mg daily   Labs/ tests ordered today include:  No orders of the defined types were placed in this encounter.    Disposition:   FU with me in one year  Signed, Tallin Hart Martinique, MD  12/09/2019 10:12 AM    [facility] [address], [place], [place], [address] Phone [phone], Fax [phone]

## 2019-12-09 ENCOUNTER — Ambulatory Visit (INDEPENDENT_AMBULATORY_CARE_PROVIDER_SITE_OTHER): Payer: Medicare Other | Admitting: Cardiology

## 2019-12-09 ENCOUNTER — Encounter: Payer: Self-pay | Admitting: Cardiology

## 2019-12-09 VITALS — BP 130/56 | HR 77 | Ht 68.0 in | Wt 230.0 lb

## 2019-12-09 DIAGNOSIS — I2510 Atherosclerotic heart disease of native coronary artery without angina pectoris: Secondary | ICD-10-CM | POA: Diagnosis not present

## 2019-12-09 DIAGNOSIS — E785 Hyperlipidemia, unspecified: Secondary | ICD-10-CM | POA: Diagnosis not present

## 2019-12-09 DIAGNOSIS — Z951 Presence of aortocoronary bypass graft: Secondary | ICD-10-CM

## 2019-12-13 DIAGNOSIS — L570 Actinic keratosis: Secondary | ICD-10-CM | POA: Diagnosis not present

## 2019-12-13 DIAGNOSIS — Z08 Encounter for follow-up examination after completed treatment for malignant neoplasm: Secondary | ICD-10-CM | POA: Diagnosis not present

## 2019-12-13 DIAGNOSIS — Z85828 Personal history of other malignant neoplasm of skin: Secondary | ICD-10-CM | POA: Diagnosis not present

## 2019-12-16 DIAGNOSIS — Z23 Encounter for immunization: Secondary | ICD-10-CM | POA: Diagnosis not present

## 2019-12-28 DIAGNOSIS — Z7189 Other specified counseling: Secondary | ICD-10-CM | POA: Diagnosis not present

## 2019-12-28 DIAGNOSIS — R5383 Other fatigue: Secondary | ICD-10-CM | POA: Diagnosis not present

## 2019-12-28 DIAGNOSIS — Z6836 Body mass index (BMI) 36.0-36.9, adult: Secondary | ICD-10-CM | POA: Diagnosis not present

## 2019-12-28 DIAGNOSIS — E119 Type 2 diabetes mellitus without complications: Secondary | ICD-10-CM | POA: Diagnosis not present

## 2019-12-28 DIAGNOSIS — M159 Polyosteoarthritis, unspecified: Secondary | ICD-10-CM | POA: Diagnosis not present

## 2019-12-28 DIAGNOSIS — E1165 Type 2 diabetes mellitus with hyperglycemia: Secondary | ICD-10-CM | POA: Diagnosis not present

## 2019-12-28 DIAGNOSIS — Z299 Encounter for prophylactic measures, unspecified: Secondary | ICD-10-CM | POA: Diagnosis not present

## 2019-12-28 DIAGNOSIS — E7800 Pure hypercholesterolemia, unspecified: Secondary | ICD-10-CM | POA: Diagnosis not present

## 2019-12-28 DIAGNOSIS — Z1331 Encounter for screening for depression: Secondary | ICD-10-CM | POA: Diagnosis not present

## 2019-12-28 DIAGNOSIS — Z79899 Other long term (current) drug therapy: Secondary | ICD-10-CM | POA: Diagnosis not present

## 2019-12-28 DIAGNOSIS — J449 Chronic obstructive pulmonary disease, unspecified: Secondary | ICD-10-CM | POA: Diagnosis not present

## 2019-12-28 DIAGNOSIS — Z1339 Encounter for screening examination for other mental health and behavioral disorders: Secondary | ICD-10-CM | POA: Diagnosis not present

## 2019-12-28 DIAGNOSIS — Z125 Encounter for screening for malignant neoplasm of prostate: Secondary | ICD-10-CM | POA: Diagnosis not present

## 2019-12-28 DIAGNOSIS — Z0000 Encounter for general adult medical examination without abnormal findings: Secondary | ICD-10-CM | POA: Diagnosis not present

## 2019-12-28 DIAGNOSIS — K219 Gastro-esophageal reflux disease without esophagitis: Secondary | ICD-10-CM | POA: Diagnosis not present

## 2020-01-26 DIAGNOSIS — Z6835 Body mass index (BMI) 35.0-35.9, adult: Secondary | ICD-10-CM | POA: Diagnosis not present

## 2020-01-26 DIAGNOSIS — K219 Gastro-esophageal reflux disease without esophagitis: Secondary | ICD-10-CM | POA: Diagnosis not present

## 2020-01-26 DIAGNOSIS — F419 Anxiety disorder, unspecified: Secondary | ICD-10-CM | POA: Diagnosis not present

## 2020-01-26 DIAGNOSIS — E1165 Type 2 diabetes mellitus with hyperglycemia: Secondary | ICD-10-CM | POA: Diagnosis not present

## 2020-01-26 DIAGNOSIS — Z299 Encounter for prophylactic measures, unspecified: Secondary | ICD-10-CM | POA: Diagnosis not present

## 2020-01-26 DIAGNOSIS — E1122 Type 2 diabetes mellitus with diabetic chronic kidney disease: Secondary | ICD-10-CM | POA: Diagnosis not present

## 2020-01-26 DIAGNOSIS — Z87891 Personal history of nicotine dependence: Secondary | ICD-10-CM | POA: Diagnosis not present

## 2020-01-27 DIAGNOSIS — E7800 Pure hypercholesterolemia, unspecified: Secondary | ICD-10-CM | POA: Diagnosis not present

## 2020-01-27 DIAGNOSIS — E119 Type 2 diabetes mellitus without complications: Secondary | ICD-10-CM | POA: Diagnosis not present

## 2020-01-27 DIAGNOSIS — M159 Polyosteoarthritis, unspecified: Secondary | ICD-10-CM | POA: Diagnosis not present

## 2020-01-27 DIAGNOSIS — J449 Chronic obstructive pulmonary disease, unspecified: Secondary | ICD-10-CM | POA: Diagnosis not present

## 2020-04-23 IMAGING — DX PORTABLE CHEST - 1 VIEW
1 series · 1 of 1 positions shown · non-contrast
Comparison: 06/29/2018

CLINICAL DATA: Status post coronary bypass grafting

EXAM:
PORTABLE CHEST 1 VIEW

[chest ap]
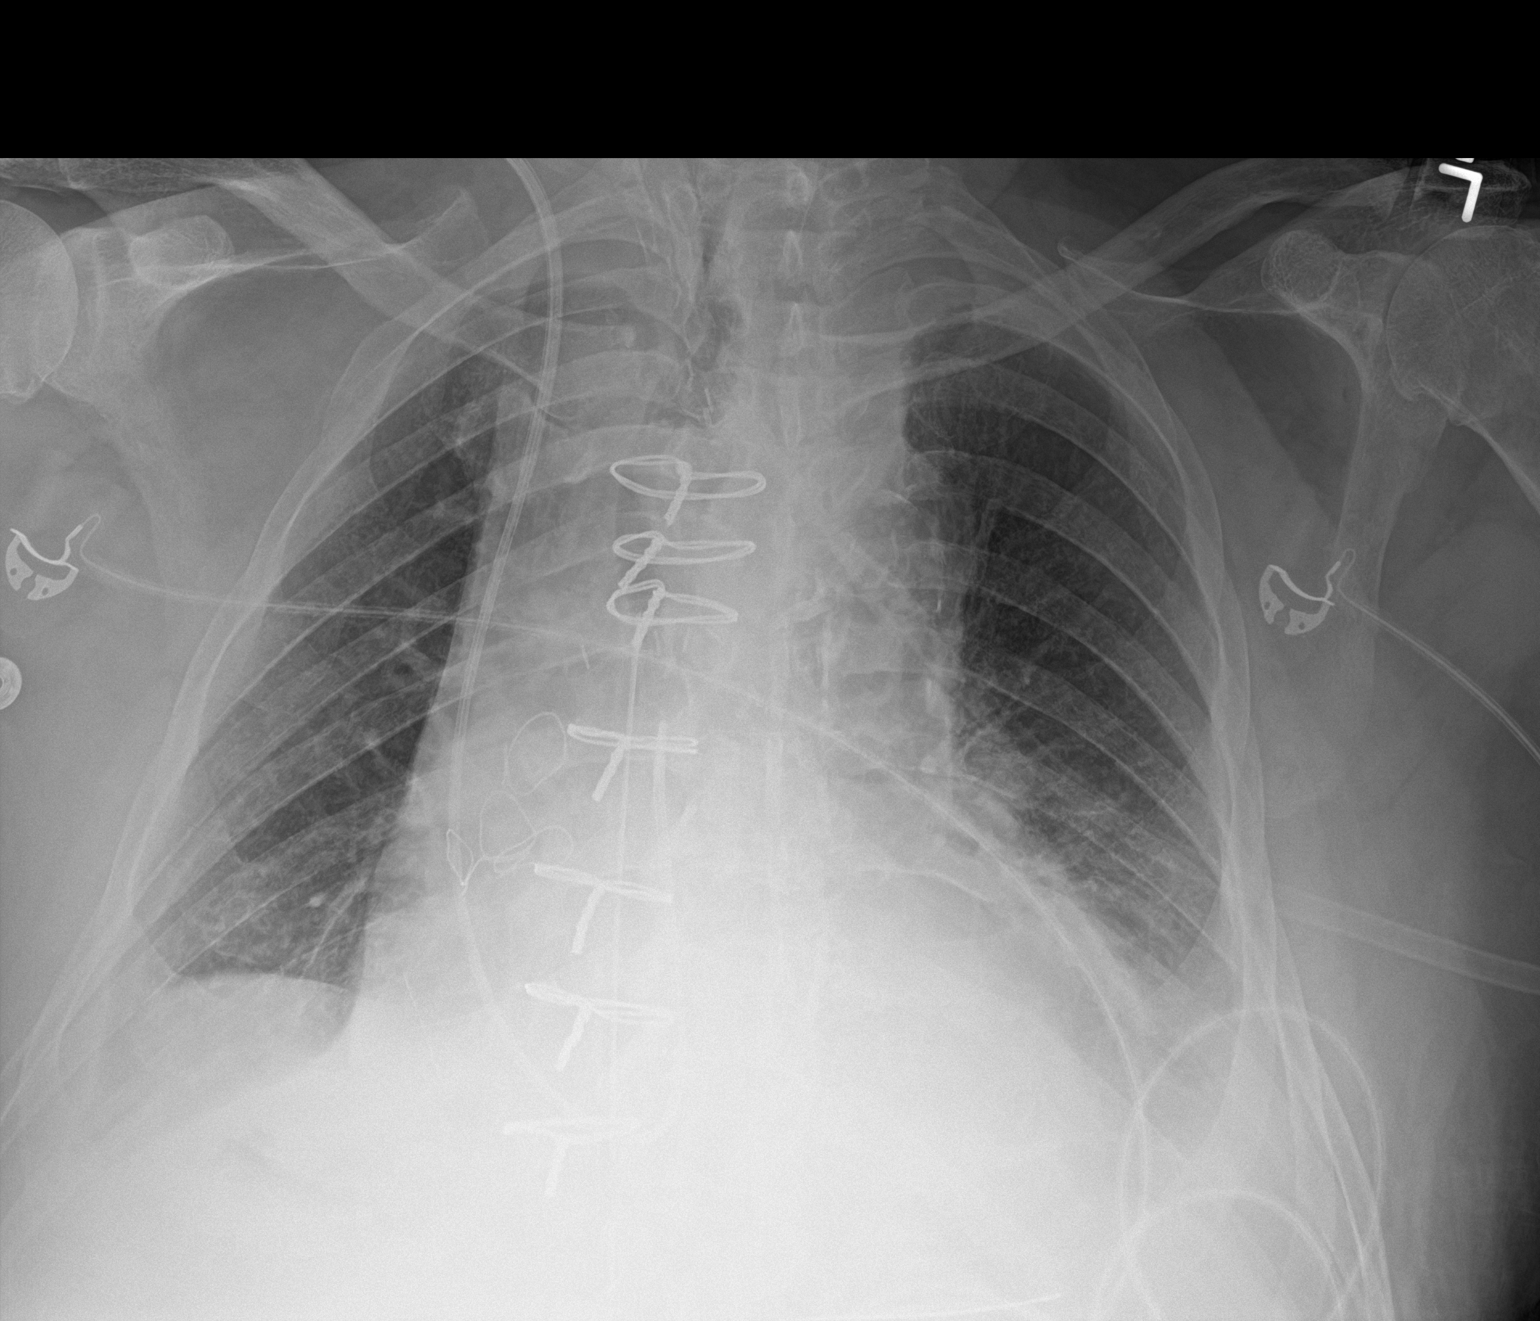

[1 of 1 positions shown; findings below may reference images not displayed]

FINDINGS: Endotracheal tube and gastric catheter have been removed in the
interval. Swan-Ganz catheter, mediastinal drain and pericardial
drain are again seen and stable. No pneumothorax is noted. Small
bilateral pleural effusions are noted posteriorly. No focal
confluent infiltrate is seen. No bony abnormality is noted.
IMPRESSION: Stable small bilateral pleural effusions. No new focal abnormality
is noted.

## 2020-04-26 IMAGING — CR CHEST - 2 VIEW
2 series · 2 of 2 positions shown · non-contrast
Comparison: 07/01/2018

CLINICAL DATA: Pleural effusions

EXAM:
CHEST - 2 VIEW

[chest lat]
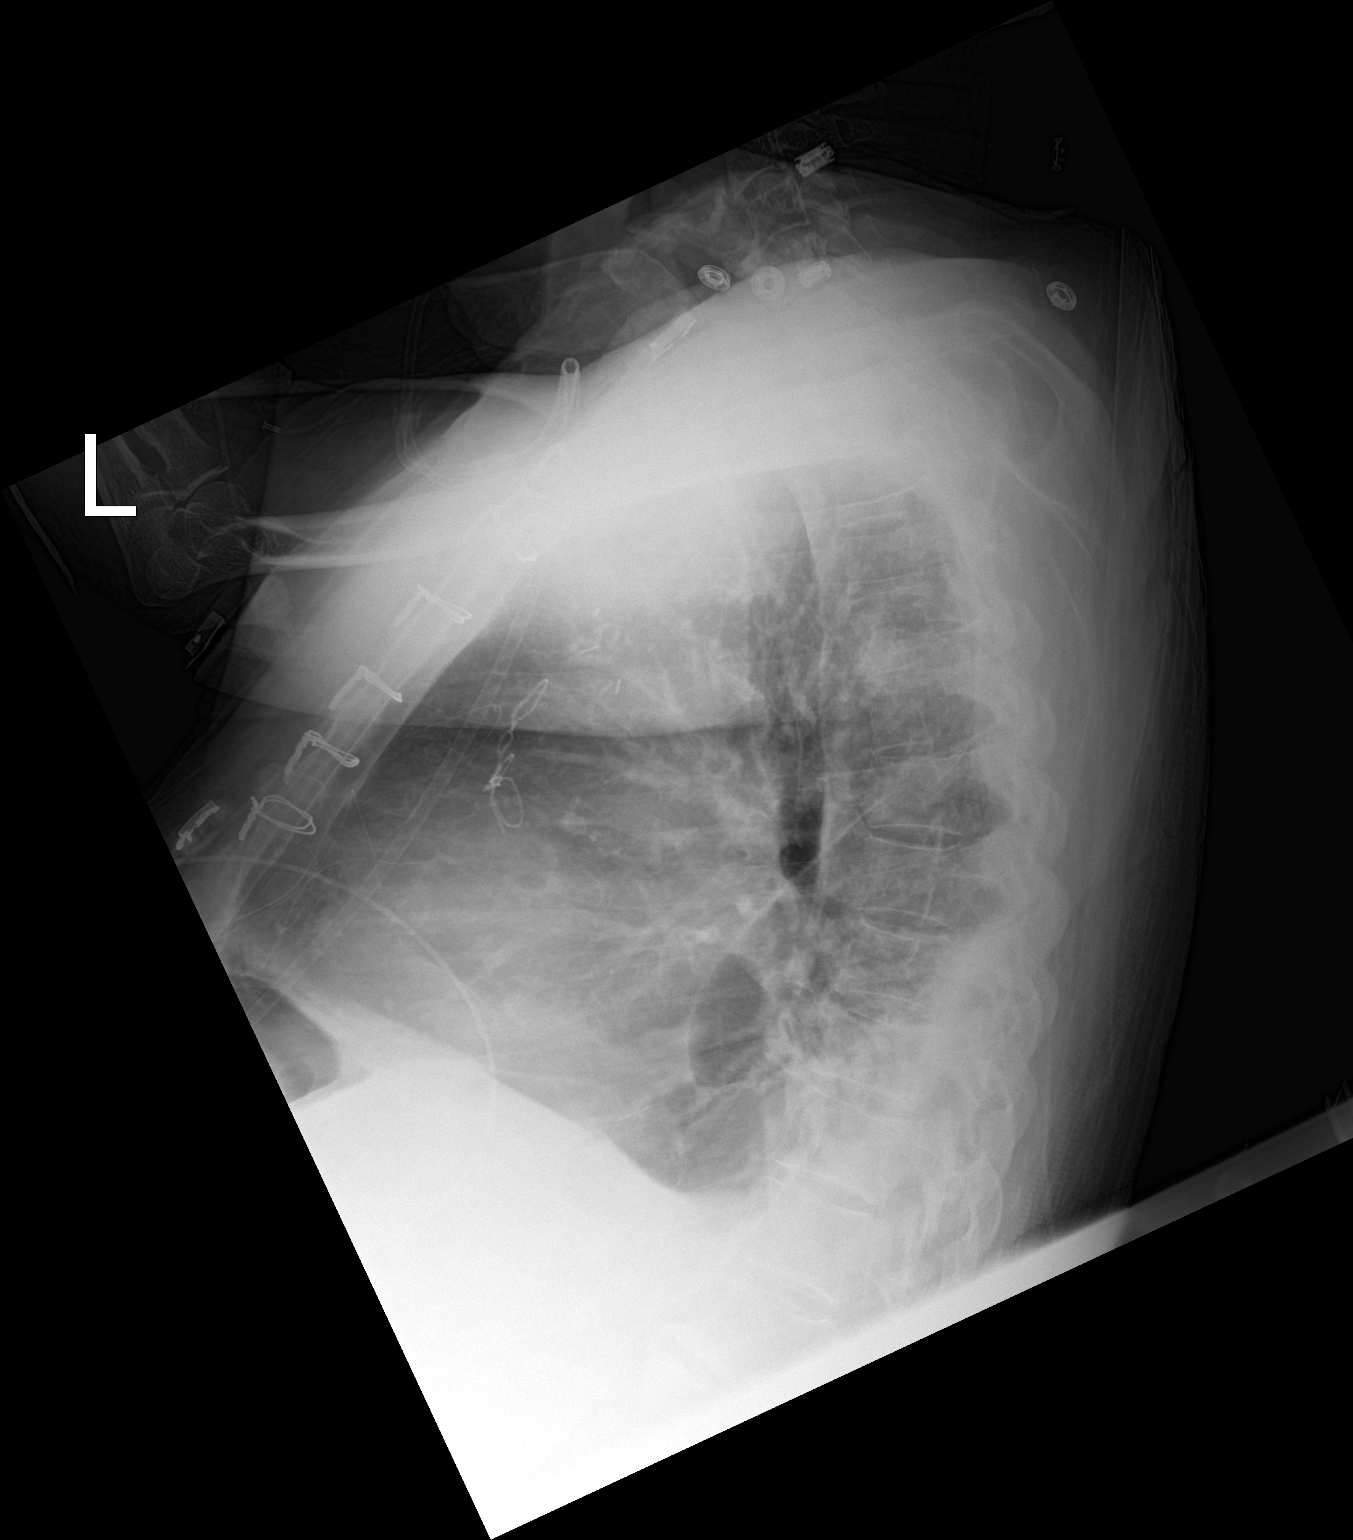

[chest ap]
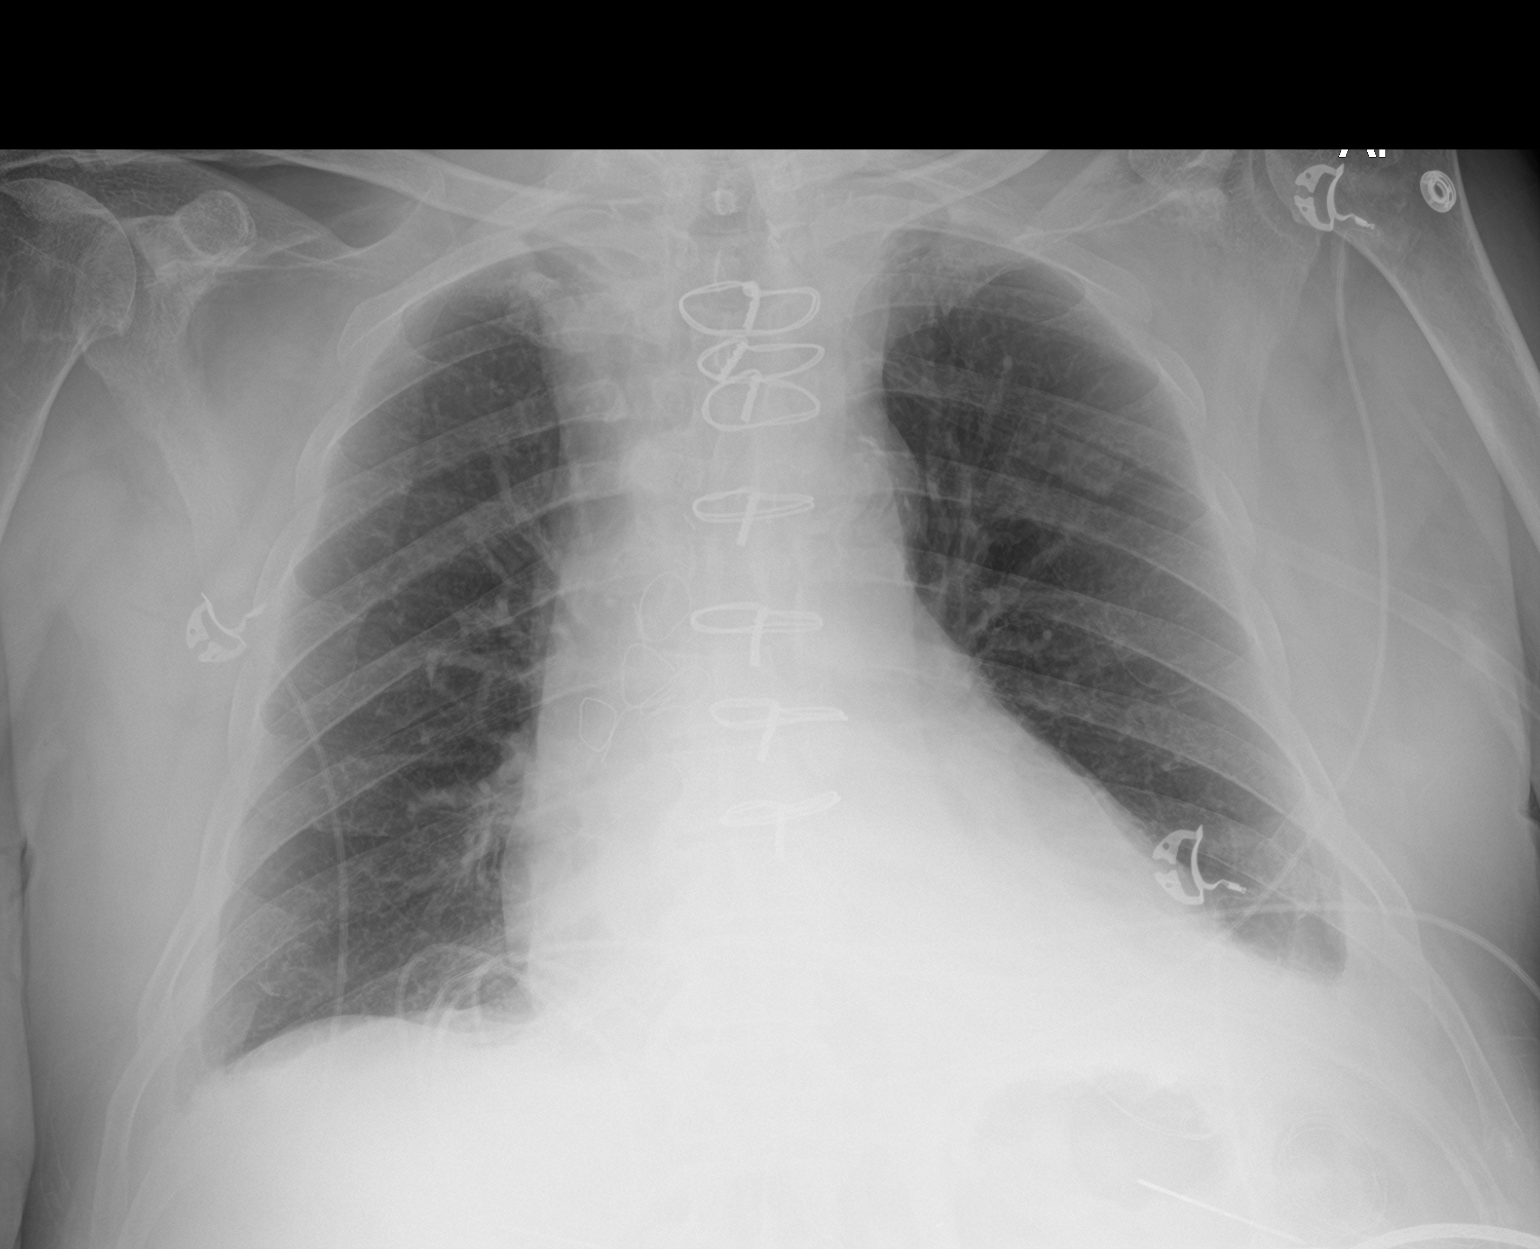

[2 of 2 positions shown; findings below may reference images not displayed]

FINDINGS: Small bilateral pleural effusions are similar to prior study. Left
base atelectasis. Mild cardiomegaly. Prior CABG. No overt edema or
acute bony abnormality.
IMPRESSION: Small bilateral pleural effusions.  Left base atelectasis.

Mild cardiomegaly.

## 2020-05-28 IMAGING — DX CHEST - 2 VIEW
2 series · 2 of 2 positions shown · non-contrast
Comparison: 07/03/2018

CLINICAL DATA: Bypass surgery.

EXAM:
CHEST - 2 VIEW

[dg chest 2 view (1 of 2)]
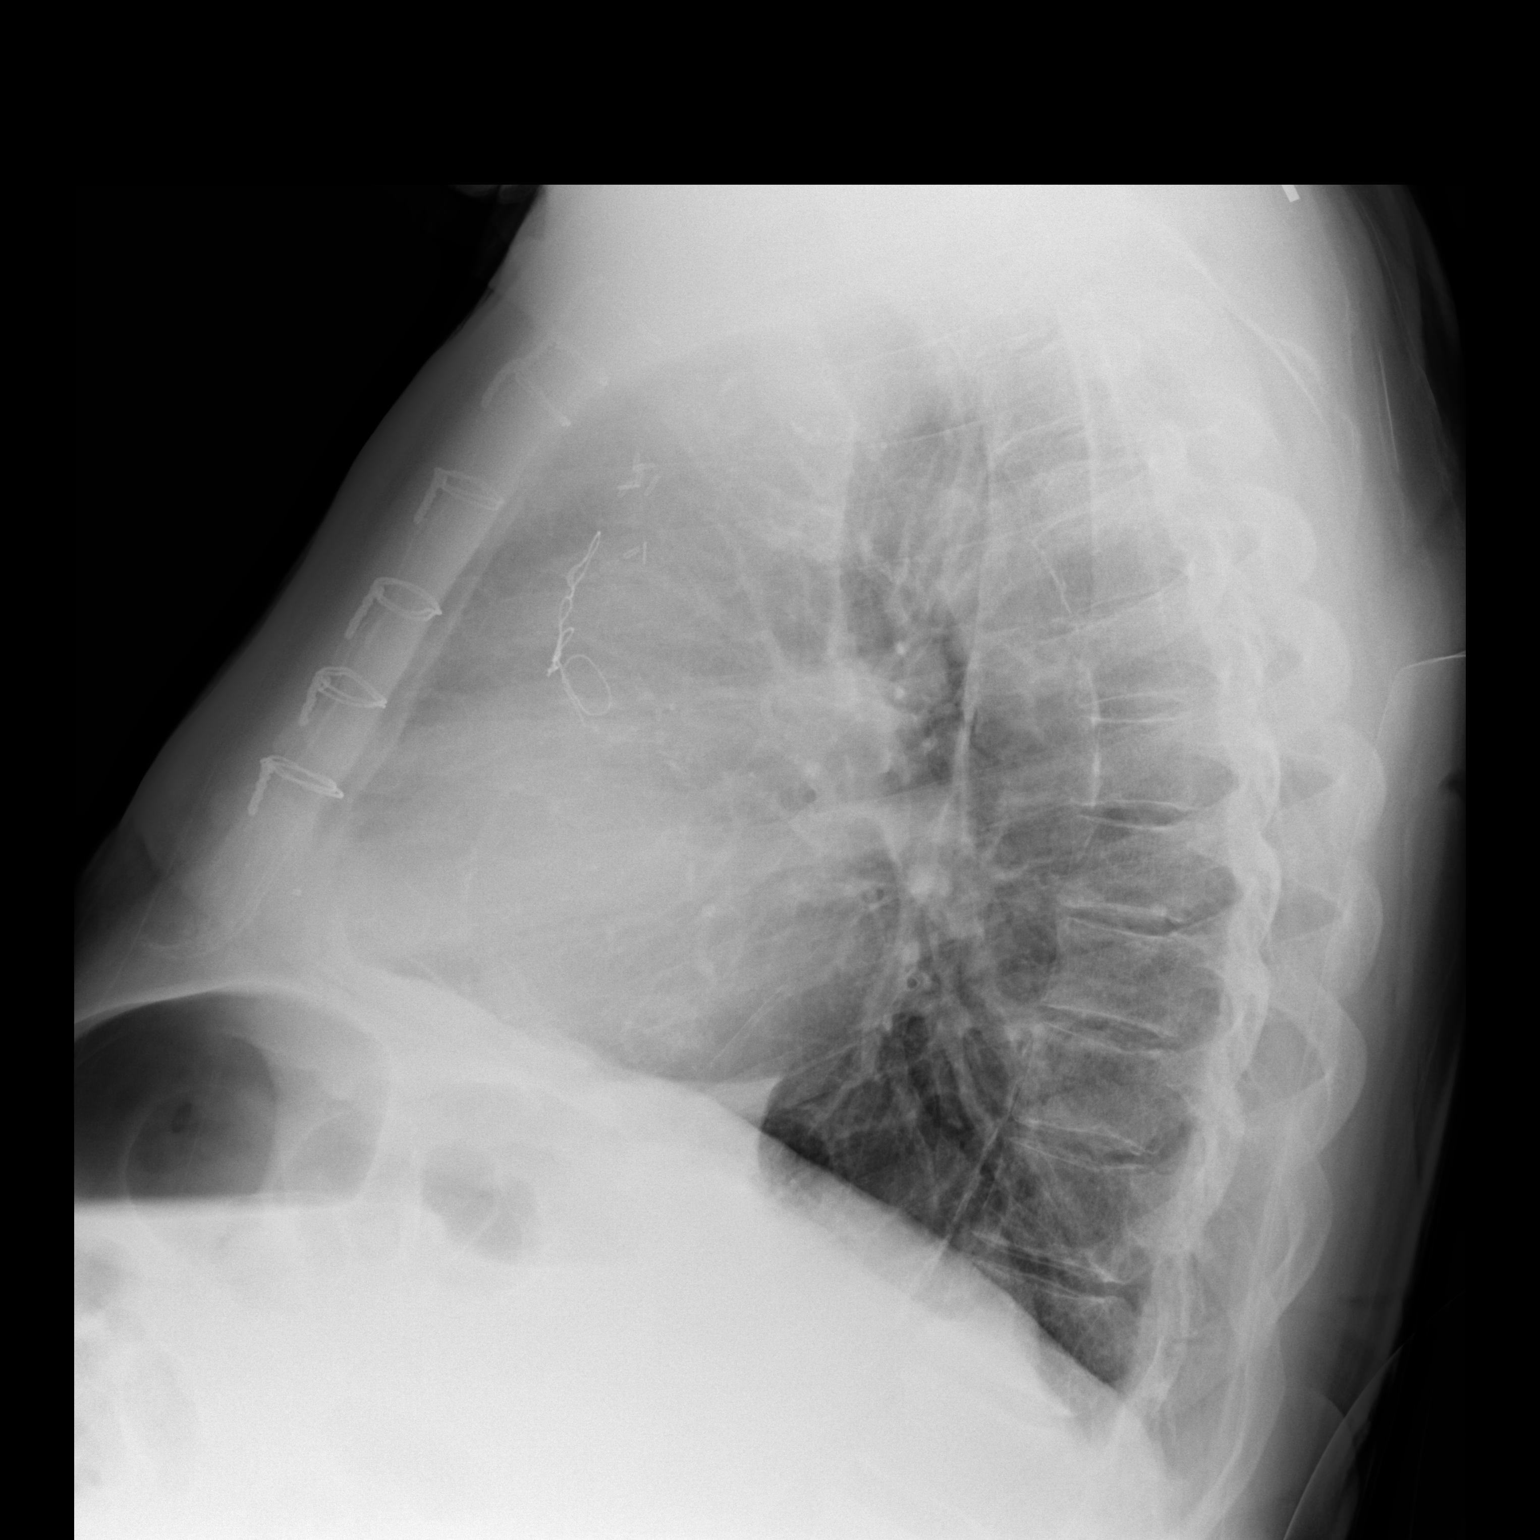

[dg chest 2 view (2 of 2)]
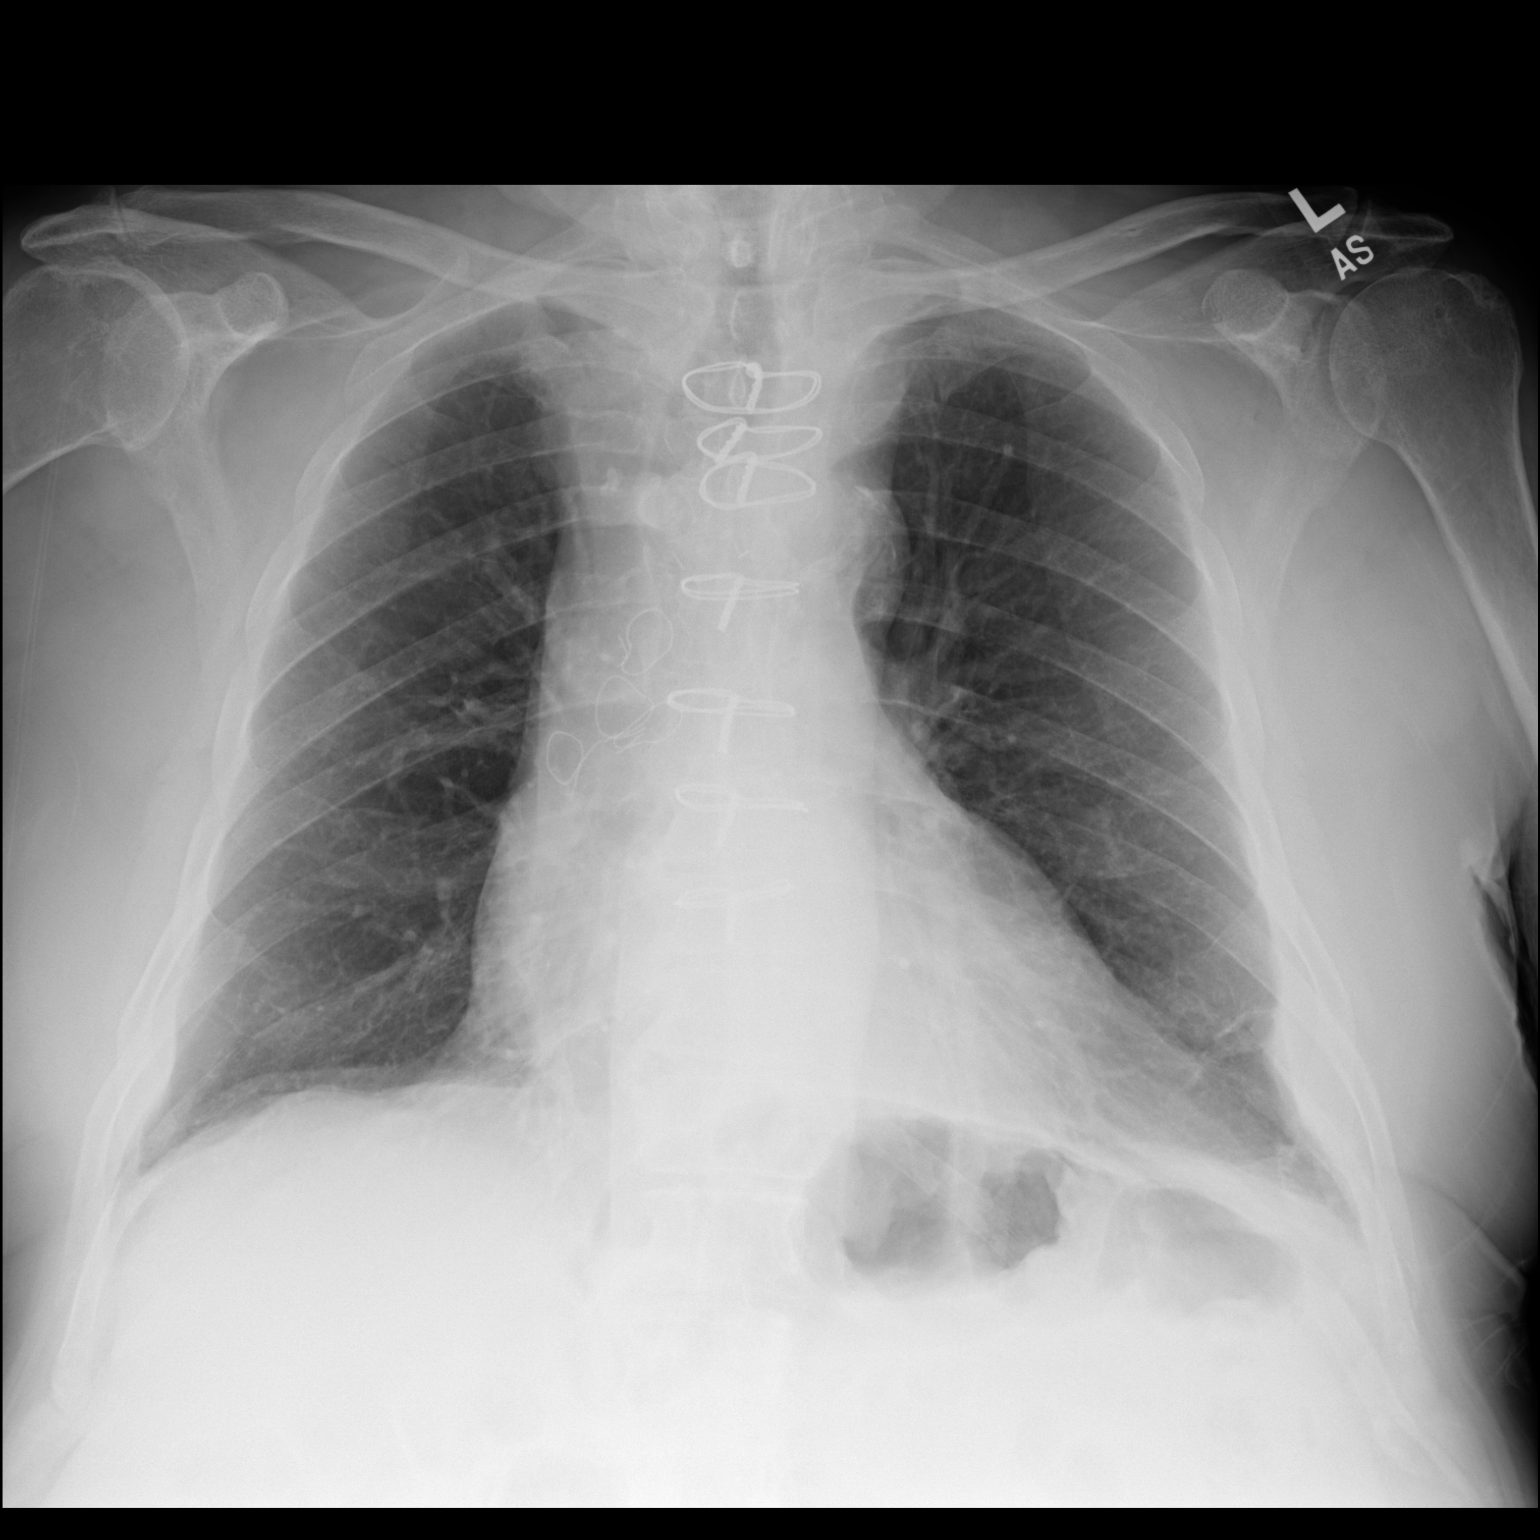

[2 of 2 positions shown; findings below may reference images not displayed]

FINDINGS: Stable surgical changes from triple bypass surgery. The heart is
normal in size. Stable mild tortuosity and calcification of the
thoracic aorta. No pulmonary edema or pulmonary infiltrates. The
left pleural effusion has resolved. There is minimal residual
lateral pleural thickening. The bony structures are intact.
IMPRESSION: Stable surgical changes from triple bypass surgery.

No acute pulmonary findings.

Resolution of pleural effusions. Minimal residual lateral pleural
thickening.

## 2020-09-25 ENCOUNTER — Other Ambulatory Visit: Payer: Self-pay | Admitting: Adult Health

## 2020-10-10 ENCOUNTER — Other Ambulatory Visit: Payer: Self-pay | Admitting: Cardiology

## 2020-11-20 ENCOUNTER — Other Ambulatory Visit: Payer: Self-pay | Admitting: Cardiology

## 2020-12-08 ENCOUNTER — Ambulatory Visit: Payer: Medicare Other | Admitting: Adult Health

## 2020-12-11 NOTE — Progress Notes (Signed)
Cardiology Office Note:    Date:  12/12/2020   ID:  AROLDO GALLI, DOB 13-Dec-1925, MRN [id number]  PCP:  Monico Blitz, [place] Cardiologist: Peter Martinique, MD   Reason for visit: 1 year follow-up  History of Present Illness:    John Palmer is a 87 y.o. male with a hx of CAD s/p coronary artery bypass grafting x4 on 06/29/2018 (SVG to LAD, SVG to diagonal, SVG to OM, SVG to PDA, with endoscopic vein harvest from the right leg on 06/29/2018.    He last was seen by Dr. Martinique 1 year ago.  Patient is doing well walking regularly around the block without chest pain or shortness of breath.  Today, patient states he is doing well.  He has had no chest pain, shortness of breath, PND, orthopnea, lightheadedness, syncope or palpitations.  He uses his CPAP.  He states he has chronic mild leg swelling.  Wife mentions that the patient urinates frequently and spends a long time the bathroom.  It sounds like he has decreased urinary stream and BPH symptoms.  He states he is walking two thirds of block.  Wife states he could move more.  They say that his blood pressure is always good at doctor's offices.  It mentions that he is also seeing a [facility] cardiologist that they were seeing before CABG.     Past Medical History:  Diagnosis Date   GERD (gastroesophageal reflux disease)    Hypercholesterolemia     Past Surgical History:  Procedure Laterality Date   "stomach wrap"     BLADDER SURGERY     CORONARY ARTERY BYPASS GRAFT N/A 06/29/2018   Procedure: CORONARY ARTERY BYPASS GRAFTING (CABG)TIMES FOUR USING THE RIGHT GREATER SAPHENOUS VEIN, HARVESTED ENDOSCOPICALLY;  Surgeon: Gaye Pollack, MD;  Location: [facility];  Service: Open Heart Surgery;  Laterality: N/A;   HEMORRHOID SURGERY     LEFT HEART CATH AND CORONARY ANGIOGRAPHY N/A 06/26/2018   Procedure: LEFT HEART CATH AND CORONARY ANGIOGRAPHY;  Surgeon: Belva Crome, MD;  Location: [facility];  Service: Cardiovascular;   Laterality: N/A;   TEE WITHOUT CARDIOVERSION N/A 06/29/2018   Procedure: Transesophageal Echocardiogram (Tee);  Surgeon: Gaye Pollack, MD;  Location: [facility];  Service: Open Heart Surgery;  Laterality: N/A;    Current Medications: Current Meds  Medication Sig   aspirin EC 81 MG tablet Take 1 tablet (81 mg total) by mouth daily.   atorvastatin (LIPITOR) 80 MG tablet TAKE 1 TABLET (80 MG TOTAL) BY MOUTH DAILY AT 6 PM.   Cholecalciferol (VITAMIN D3) 125 MCG (5000 UT) CAPS Take 5,000 Units by mouth daily.   citalopram (CELEXA) 10 MG tablet Take 10 mg by mouth daily.   co-enzyme Q-10 30 MG capsule Take 30 mg by mouth daily.   ezetimibe (ZETIA) 10 MG tablet TAKE 1 TABLET (10 MG TOTAL) BY MOUTH DAILY.   gabapentin (NEURONTIN) 100 MG capsule Take 100 mg twice a day   Omega-3 1000 MG CAPS Take 2 g by mouth daily.   rOPINIRole (REQUIP) 2 MG tablet Take 2-4 mg by mouth See admin instructions. Takes 2 mg in the morning and 4 mg in the evening   saw palmetto 160 MG capsule Take 160 mg by mouth daily.   traMADol (ULTRAM) 50 MG tablet Take 1 tablet (50 mg total) by mouth every 12 (twelve) hours as needed for moderate pain. Must get refills with PCP   traZODone (DESYREL) 100 MG tablet Take  100 mg by mouth at bedtime.   vitamin B-12 (CYANOCOBALAMIN) 100 MCG tablet Take 100 mcg by mouth daily.   [DISCONTINUED] furosemide (LASIX) 40 MG tablet TAKE 1 TABLET (40 MG TOTAL) BY MOUTH EVERY OTHER DAY.     Allergies:   Levofloxacin   Social History   Socioeconomic History   Marital status: Married    Spouse name: Not on file   Number of children: Not on file   Years of education: Not on file   Highest education level: Not on file  Occupational History   Not on file  Tobacco Use   Smoking status: Never   Smokeless tobacco: Never  Substance and Sexual Activity   Alcohol use: Yes   Drug use: Never   Sexual activity: Not on file  Other Topics Concern   Not on file  Social History Narrative   Not on  file   Social Determinants of Health   Financial Resource Strain: Not on file  Food Insecurity: Not on file  Transportation Needs: Not on file  Physical Activity: Not on file  Stress: Not on file  Social Connections: Not on file     Family History: The patient's family history is not on file.  ROS:   Please see the history of present illness.     EKGs/Labs/Other Studies Reviewed:    EKG:  The ekg ordered today demonstrates normal sinus rhythm with first-degree AV block, right bundle branch block, heart rate 73, PR interval 246 ms, QRS duration 140 ms.  No significant change from prior.  Recent Labs: No results found for requested labs within last 8760 hours.   Recent Lipid Panel Lab Results  Component Value Date/Time   CHOL 155 09/21/2018 09:25 AM   TRIG 143 09/21/2018 09:25 AM   HDL 43 09/21/2018 09:25 AM   LDLCALC 83 09/21/2018 09:25 AM    Physical Exam:    VS:  BP (!) 150/80 (BP Location: Left Arm, Patient Position: Sitting, Cuff Size: Normal)   Pulse 73   Resp 20   Ht 5\' 8"  (1.727 m)   Wt 241 lb (109.3 kg)   SpO2 95%   BMI 36.64 kg/m    No data found.  Wt Readings from Last 3 Encounters:  12/12/20 241 lb (109.3 kg)  12/09/19 230 lb (104.3 kg)  05/25/19 226 lb (102.5 kg)     GEN:  Well nourished, well developed in no acute distress, obese HEENT: Normal NECK: No JVD; No carotid bruits CARDIAC: RRR, no murmurs, rubs, gallops RESPIRATORY:  Clear to auscultation without rales, wheezing or rhonchi  ABDOMEN: Soft, non-tender, non-distended MUSCULOSKELETAL: Trace LE edema bilaterally; No deformity  SKIN: Warm and dry NEUROLOGIC:  Alert and oriented PSYCHIATRIC:  Normal affect     ASSESSMENT AND PLAN   Coronary artery disease, no angina -multivessel disease status post four-vessel coronary artery bypass grafting on 06/29/2018 -cont ASA and statin -Recommend to keep mobility. -Told them they just need to follow-up with 1 cardiologist whoever they  choose  Lower extremity edema -Likely secondary to morbid obesity -Discussed salt restriction, leg elevation and recommendation to increase activity. -Secondary to urinary frequency, recommend decreasing Lasix to 20 mg every other day.  If leg swelling worsens or experiences shortness of breath, can go back to Lasix 40 mg every other day.   Hypercholesterolemia with goal LDL <70 -Continue atorvastatin 80 mg daily and Zetia.  -Labs followed by Dr Manuella Ghazi.  Dispo: Follow-up with Dr. Martinique 1 year or [place] cardiology, whoever  they decide to continue cardiac care with.        Medication Adjustments/Labs and Tests Ordered: Current medicines are reviewed at length with the patient today.  Concerns regarding medicines are outlined above.  Orders Placed This Encounter  Procedures   EKG 12-Lead   Meds ordered this encounter  Medications   furosemide (LASIX) 20 MG tablet    Sig: Take 1 tablet (20 mg total) by mouth every other day.    Dispense:  45 tablet    Refill:  3    Dose change new Rx    Patient Instructions  Medication Instructions:  DECREASE Lasix to 20 mg every other day. May cut the 40 mg tablet in half every other day  *If you need a refill on your cardiac medications before your next appointment, please call your pharmacy*  Lab Work: NONE ordered at this time of appointment   If you have labs (blood work) drawn today and your tests are completely normal, you will receive your results only by: [facility] (if you have MyChart) OR A paper copy in the mail If you have any lab test that is abnormal or we need to change your treatment, we will call you to review the results.  Testing/Procedures: NONE ordered at this time of appointment   Follow-Up: At [facility], you and your health needs are our priority.  As part of our continuing mission to provide you with exceptional heart care, we have created designated Provider Care Teams.  These Care Teams include your  primary Cardiologist (physician) and Advanced Practice Providers (APPs -  Physician Assistants and Nurse Practitioners) who all work together to provide you with the care you need, when you need it.  We recommend signing up for the patient portal called "MyChart".  Sign up information is provided on this After Visit Summary.  MyChart is used to connect with patients for Virtual Visits (Telemedicine).  Patients are able to view lab/test results, encounter notes, upcoming appointments, etc.  Non-urgent messages can be sent to your provider as well.   To learn more about what you can do with MyChart, go to [email or web address].    Your next appointment:   1 year(s)  The format for your next appointment:   In Person  Provider:   Peter Martinique, MD    Other Instructions    Signed, Warren Lacy, PA-C  12/12/2020 11:32 AM    [place]

## 2020-12-12 ENCOUNTER — Other Ambulatory Visit: Payer: Self-pay

## 2020-12-12 ENCOUNTER — Encounter: Payer: Self-pay | Admitting: Physician Assistant

## 2020-12-12 ENCOUNTER — Ambulatory Visit (INDEPENDENT_AMBULATORY_CARE_PROVIDER_SITE_OTHER): Payer: Medicare Other | Admitting: Physician Assistant

## 2020-12-12 VITALS — BP 150/80 | HR 73 | Resp 20 | Ht 68.0 in | Wt 241.0 lb

## 2020-12-12 DIAGNOSIS — R600 Localized edema: Secondary | ICD-10-CM

## 2020-12-12 DIAGNOSIS — I2510 Atherosclerotic heart disease of native coronary artery without angina pectoris: Secondary | ICD-10-CM

## 2020-12-12 DIAGNOSIS — Z951 Presence of aortocoronary bypass graft: Secondary | ICD-10-CM | POA: Diagnosis not present

## 2020-12-12 DIAGNOSIS — E785 Hyperlipidemia, unspecified: Secondary | ICD-10-CM | POA: Diagnosis not present

## 2020-12-12 MED ORDER — FUROSEMIDE 20 MG PO TABS: 20.0000 mg | ORAL_TABLET | ORAL | 3 refills | Status: AC

## 2020-12-12 NOTE — Patient Instructions (Addendum)
Medication Instructions:  DECREASE Lasix to 20 mg every other day. May cut the 40 mg tablet in half every other day  *If you need a refill on your cardiac medications before your next appointment, please call your pharmacy*  Lab Work: NONE ordered at this time of appointment   If you have labs (blood work) drawn today and your tests are completely normal, you will receive your results only by: [facility] (if you have MyChart) OR A paper copy in the mail If you have any lab test that is abnormal or we need to change your treatment, we will call you to review the results.  Testing/Procedures: NONE ordered at this time of appointment   Follow-Up: At [facility], you and your health needs are our priority.  As part of our continuing mission to provide you with exceptional heart care, we have created designated Provider Care Teams.  These Care Teams include your primary Cardiologist (physician) and Advanced Practice Providers (APPs -  Physician Assistants and Nurse Practitioners) who all work together to provide you with the care you need, when you need it.  We recommend signing up for the patient portal called "MyChart".  Sign up information is provided on this After Visit Summary.  MyChart is used to connect with patients for Virtual Visits (Telemedicine).  Patients are able to view lab/test results, encounter notes, upcoming appointments, etc.  Non-urgent messages can be sent to your provider as well.   To learn more about what you can do with MyChart, go to [email or web address].    Your next appointment:   1 year(s)  The format for your next appointment:   In Person  Provider:   Peter Martinique, MD    Other Instructions

## 2020-12-28 ENCOUNTER — Other Ambulatory Visit: Payer: Self-pay | Admitting: Cardiology

## 2021-01-15 ENCOUNTER — Other Ambulatory Visit: Payer: Self-pay | Admitting: Cardiology

## 2021-03-28 ENCOUNTER — Other Ambulatory Visit: Payer: Self-pay | Admitting: Cardiology

## 2022-04-18 ENCOUNTER — Other Ambulatory Visit: Payer: Self-pay | Admitting: Cardiology
